# Patient Record
Sex: Female | Born: 1975 | Hispanic: No | Marital: Married | Smoking: Former smoker
Health system: Southern US, Community
[De-identification: ages and names within clinical notes are randomized; demographics above are authoritative.]

## PROBLEM LIST (undated history)

## (undated) ENCOUNTER — Inpatient Hospital Stay (HOSPITAL_COMMUNITY): Payer: Self-pay

## (undated) DIAGNOSIS — H8091 Unspecified otosclerosis, right ear: Secondary | ICD-10-CM

## (undated) DIAGNOSIS — G8929 Other chronic pain: Secondary | ICD-10-CM

## (undated) DIAGNOSIS — H9201 Otalgia, right ear: Secondary | ICD-10-CM

## (undated) DIAGNOSIS — R51 Headache: Secondary | ICD-10-CM

## (undated) DIAGNOSIS — R519 Headache, unspecified: Secondary | ICD-10-CM

## (undated) DIAGNOSIS — H9191 Unspecified hearing loss, right ear: Secondary | ICD-10-CM

## (undated) HISTORY — DX: Otalgia, right ear: H92.01

## (undated) HISTORY — DX: Unspecified otosclerosis, right ear: H80.91

## (undated) HISTORY — DX: Other chronic pain: G89.29

---

## 1997-07-27 DIAGNOSIS — G931 Anoxic brain damage, not elsewhere classified: Secondary | ICD-10-CM

## 2015-05-29 ENCOUNTER — Ambulatory Visit (INDEPENDENT_AMBULATORY_CARE_PROVIDER_SITE_OTHER): Payer: Medicaid Other | Admitting: Family Medicine

## 2015-05-29 VITALS — BP 104/69 | HR 74 | Temp 98.3°F | Ht 62.0 in | Wt 157.7 lb

## 2015-05-29 DIAGNOSIS — H9201 Otalgia, right ear: Secondary | ICD-10-CM

## 2015-05-29 DIAGNOSIS — G8929 Other chronic pain: Secondary | ICD-10-CM

## 2015-05-29 DIAGNOSIS — Z758 Other problems related to medical facilities and other health care: Secondary | ICD-10-CM

## 2015-05-29 DIAGNOSIS — Z008 Encounter for other general examination: Secondary | ICD-10-CM | POA: Diagnosis not present

## 2015-05-29 DIAGNOSIS — Z0289 Encounter for other administrative examinations: Secondary | ICD-10-CM

## 2015-05-29 DIAGNOSIS — Z789 Other specified health status: Secondary | ICD-10-CM | POA: Diagnosis not present

## 2015-05-29 MED ORDER — ACETAMINOPHEN 325 MG PO TABS: 650.0000 mg | ORAL_TABLET | Freq: Four times a day (QID) | ORAL | Status: DC | PRN

## 2015-05-29 NOTE — Patient Instructions (Signed)
Return in 2-3 weeks Take tylenol for head ache If you have questions or concerns, [facility]

## 2015-05-31 DIAGNOSIS — G8929 Other chronic pain: Secondary | ICD-10-CM

## 2015-05-31 DIAGNOSIS — R519 Headache, unspecified: Secondary | ICD-10-CM | POA: Insufficient documentation

## 2015-05-31 DIAGNOSIS — Z758 Other problems related to medical facilities and other health care: Secondary | ICD-10-CM | POA: Insufficient documentation

## 2015-05-31 DIAGNOSIS — Z789 Other specified health status: Secondary | ICD-10-CM | POA: Insufficient documentation

## 2015-05-31 DIAGNOSIS — H9201 Otalgia, right ear: Secondary | ICD-10-CM

## 2015-05-31 DIAGNOSIS — R51 Headache: Secondary | ICD-10-CM

## 2015-05-31 DIAGNOSIS — Z0289 Encounter for other administrative examinations: Secondary | ICD-10-CM | POA: Insufficient documentation

## 2015-05-31 HISTORY — DX: Other chronic pain: G89.29

## 2015-05-31 NOTE — Assessment & Plan Note (Signed)
Follow labs 

## 2015-05-31 NOTE — Progress Notes (Signed)
Arabic interpreter  utilized during today's visit.  [facility] New [facility] Visit  HPI:  [facility] patient appointment to establish general primary care, also to discuss daily headaches since arriving to the [place]. Also reports right ear ache for one year,   Right ear ache She has a history of infection in this ear requiring antibiotics 3 years ago and was told she would need imaging but could not afford it. She reports that since this infection, she has not been able to hear from this ear as well. Denies fevers/chills. Her ear pain has been stable over the last year  Headache. Reports daily headache since arriving to the [place] on 04[place]05/2015. The pain wraps around her head from the back of her head to her forehead. She reports taking Profen ( an analogue to Ibuprofen) which she got prior to arriving to the [place]. She denies history of migraines, deniss changes in vision or associated aura. She typically takes 2-3 Profen tablets per day for her headache. Thinks thiis may be related to stress and feels she is  "constantly thinking"  ROS: Denies recent illness, fevers/chills, cough, N/V/D otherwise see HPI  Past Medical Hx:  - Decreased right ear hearing after infection three years ago  Past Surgical Hx:  -Cesarean section x3  OBHx - [id number]  Family Hx: updated in Epic - Number of family members:  5, children and her husband - Number of family members in [place]:  5, children and her husband  Immigrant Social History: - Name spelling correct?: Yes - Date arrived in [place]: 04/05/2015 - Country of origin: [place] - Location of refugee camp (if applicable), how long there, and what caused patient to leave home country?: [place] - Primary language: Arabic  -Requires intepreter (essentially speaks no [place]) - Education: Highest level of education: 9th grade - Prior work: Psychologist, occupationalHouse wife - Designer, fashion/clothingTobacco/alcohol/drug use: Denies - Marriage Status: Married - Sexual  activity: With husband - Class B conditions: unknown - Were you beaten or tortured in your country or refugee camp?  No   Preventative Care History: -Seen at health department?: Yes  PHYSICAL EXAM: BP 104/69 mmHg  Pulse 74  Temp(Src) 98.3 F (36.8 C) (Oral)  Ht 5\' 2"  (1.575 m)  Wt 157 lb 11.2 oz (71.532 kg)  BMI 28.84 kg/m2  LMP 05/22/2015 (Approximate) Gen: NAD HEENT: PERRL, no conjunctival pallor, normal ears and TMs bilaterally, MMM, normal oropharynx Neck:  No cervical lymphadenopathy Heart: RRR, no murmurs Lungs: CTAB, normal WOB Abdomen: soft non tender Skin:  No lesions noted Neuro: no focal deficits Psych: normal mood and affect  Examined and interviewed with Dr. Gwendolyn GrantWalden  Refugee health examination Follow labs  Headache Given location of pain, onset after arriving to [place] and association with concurrent stressors her headache is like due to tension - Tylenol 650 q6 PRN for headache - If develops red flag symptoms will evaluate further  Chronic pain in right ear History of infection in the ear with longstanding decreased hearing, now with chronic ( 1 year history ) of stable pain with no fevers, not concerned about acute infection - Will consider referral to ENT for work up at next visit for further workup   Dorn Hartshorne A. Kennon RoundsHaney MD, MS Family Medicine Resident PGY-2 Pager [phone]

## 2015-05-31 NOTE — Assessment & Plan Note (Signed)
Given location of pain, onset [place] to [place] and association with concurrent stressors her headache is like due to tension - Tylenol 650 q6 PRN for headache - If develops red flag symptoms will evaluate further

## 2015-05-31 NOTE — Assessment & Plan Note (Signed)
History of infection in the ear with longstanding decreased hearing, now with chronic ( 1 year history ) of stable pain with no fevers, not concerned about acute infection - Will consider referral to ENT for work up at next visit for further workup

## 2015-06-19 ENCOUNTER — Ambulatory Visit: Payer: Self-pay | Admitting: Student

## 2015-06-24 ENCOUNTER — Ambulatory Visit (INDEPENDENT_AMBULATORY_CARE_PROVIDER_SITE_OTHER): Payer: Medicaid Other | Admitting: Student

## 2015-06-24 ENCOUNTER — Encounter: Payer: Self-pay | Admitting: Student

## 2015-06-24 VITALS — BP 127/77 | HR 77 | Temp 98.0°F | Ht 62.0 in | Wt 161.4 lb

## 2015-06-24 DIAGNOSIS — H9201 Otalgia, right ear: Secondary | ICD-10-CM | POA: Diagnosis not present

## 2015-06-24 DIAGNOSIS — G8929 Other chronic pain: Secondary | ICD-10-CM | POA: Diagnosis not present

## 2015-06-24 DIAGNOSIS — Z0000 Encounter for general adult medical examination without abnormal findings: Secondary | ICD-10-CM

## 2015-06-24 NOTE — Assessment & Plan Note (Signed)
Discussed need for routine pap smears but pt states she had one [facility] de[facility] - Will follow up results

## 2015-06-24 NOTE — Progress Notes (Signed)
   Subjective:    Patient ID: Cheryl Bauer, female    DOB: 02/23/1976, 39 y.o.   MRN: [id number]   CC: Follow up  HPI 39 y/o refugee F for follow up. At her last visit she complained of headache and chronic R ear pain and decreased hearing. She otherwise denies complaints, denies fevers, chills, N/V/D, cough, SOB, chest pain  R ear pain/Decreased hearing - Stable per patient.  - Right ear pain, is not constant  Headaches - Denies headache today and headaches have been helped with an OTC medicine that her husband has been bringing for her - She was given a prescritpion for tylenol at her last visit, that she did not fill   Review of Systems   See HPI for ROS.   No past medical history on file. No past surgical history on file. OB History    No data available     Social History   Social History  . Marital Status: Married    Spouse Name: N/A  . Number of Children: N/A  . Years of Education: N/A   Occupational History  . Not on file.   Social History Main Topics  . Smoking status: Never Smoker   . Smokeless tobacco: Not on file  . Alcohol Use: Not on file  . Drug Use: Not on file  . Sexual Activity: Not on file   Other Topics Concern  . 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 Kennon RoundsHaney MD, MS Family Medicine Resident PGY-2 Pager [phone]

## 2015-06-24 NOTE — Patient Instructions (Signed)
Follow up in 2 months If you have questions or concerns, call the office at [phone]

## 2015-06-24 NOTE — Assessment & Plan Note (Signed)
Stable,  - Discussed ENT follow up, however, pt states that she does not yet have Medicaid - Will wait until conformation of medicaid insurance before obtaining this consult

## 2015-08-20 ENCOUNTER — Telehealth: Payer: Self-pay | Admitting: Student

## 2015-08-20 DIAGNOSIS — G8929 Other chronic pain: Secondary | ICD-10-CM

## 2015-08-20 DIAGNOSIS — H9201 Otalgia, right ear: Principal | ICD-10-CM

## 2015-08-20 NOTE — Telephone Encounter (Signed)
Pt is needing a referral to ENT.  Please call Micah Noel because pt doesn't speak [place].

## 2015-08-20 NOTE — Telephone Encounter (Signed)
Will forward to MD.  Patient has been seen in our clinic multiple times for chronic right ear pain. Cheryl Bauer,CMA

## 2015-08-21 NOTE — Telephone Encounter (Signed)
Pt calling once more about this request. Pt was informed of message below. I checked patient's medicaid and it is in fact current and valid. Sadie Reynolds, ASA

## 2015-08-21 NOTE — Telephone Encounter (Signed)
Will place refferal, but per our last conversation she did not yet have medicaid and this was a barrier.

## 2015-10-18 ENCOUNTER — Telehealth: Payer: Self-pay | Admitting: Student

## 2015-10-18 NOTE — Telephone Encounter (Signed)
Will forward to referral coordinator to see if we need a copy of card before we can resubmit. Jazmin Hartsell,CMA

## 2015-10-18 NOTE — Telephone Encounter (Signed)
Pt has now changed her Medicaid card to our name and would the referral to the ENT sent again. jw

## 2015-10-18 NOTE — Telephone Encounter (Signed)
Referral re-sent to [facility].

## 2015-11-04 DIAGNOSIS — H9011 Conductive hearing loss, unilateral, right ear, with unrestricted hearing on the contralateral side: Secondary | ICD-10-CM | POA: Insufficient documentation

## 2015-11-25 DIAGNOSIS — H9191 Unspecified hearing loss, right ear: Secondary | ICD-10-CM

## 2015-11-25 HISTORY — DX: Unspecified hearing loss, right ear: H91.91

## 2015-12-02 ENCOUNTER — Ambulatory Visit: Payer: Self-pay | Admitting: Otolaryngology

## 2015-12-02 ENCOUNTER — Encounter (HOSPITAL_BASED_OUTPATIENT_CLINIC_OR_DEPARTMENT_OTHER): Payer: Self-pay | Admitting: *Deleted

## 2015-12-02 NOTE — H&P (Signed)
HPI:   Chief Complaint  Patient presents with  . Otalgia  Right ear pain with headaches on and off  . Hearing Loss  right ear   Cheryl Bauer is a 40 y.o. female who presents for decreased hearing in the right ear for about three years. She is a [place] refugee. Was exposed to noise in her home country but she cannot tell if anything specifically caused the change in hearing. Change described as gradual, not sudden. Hearing seems normal in the left ear. No audiometric testing. No ear pain, tinnitus, dizziness, facial weakness/numbness. No exposure to ototoxic medications. No history of recurrent ear infections or ear surgeries. Non-smoker. Interpreter present.   PMH/Meds/All/SocHx/FamHx/ROS:   No past medical history on file.  No past surgical history on file.  No family history of bleeding disorders, wound healing problems or difficulty with anesthesia.   Social History   Social History  . Marital status: Married  Spouse name: N/A  . Number of children: N/A  . Years of education: N/A   Occupational History  . Not on file.   Social History Main Topics  . Smoking status: Never Smoker  . Smokeless tobacco: Never Used  . Alcohol use No  . Drug use: Not on file  . Sexual activity: Not on file   Other Topics Concern  . Not on file   Social History Narrative  . No narrative on file   No current outpatient prescriptions on file.  A complete ROS was performed with pertinent positives/negatives noted in the HPI. The remainder of the ROS are negative.   Physical Exam:   BP 127/77  Ht 1.575 m (5\' 2" )  Wt 73 kg (161 lb)  BMI 29.45 kg/m2  General Awake, at baseline alertness during examination.  Eyes No scleral icterus or conjunctival hemorrhage. Globe position appears normal. EOMI.  Right Ear EAC patent, TM intact w/o inflammation. Middle ear well aerated.  Left Ear EAC patent, TM intact w/o inflammation. Middle ear well aerated.  Nose Patent, no polyps or masses  seen on anterior rhinoscopy.  Oral cavity No mucosal lesions or tumors seen. Tongue midline.  Oropharynx Symmetric tonsils, 2+.  Neck No abnormal cervical lymphadenopathy. No thyromegaly. No thyroid masses palpated.  Cardio-vascular No cyanosis.  Pulmonary No audible stridor. Breathing easily with no labor.  Neuro Symmetric facial movement.  Psychiatry Appropriate affect and mood for clinic visit.   Independent Review of Additional Tests or Records:  Medical records.  Procedures:  Audiometry: pure tone audiogram reveals normal hearing on the left. Right ear with severe conductive hearing loss measuring at 70dB and rising to 50dB.  Could not test word understanding due to language barrier.  Otoacoustic reflexes out of phase bilaterally.  Tympanometry: type A bilaterally.   Weber lateralizes to the right (512 Hz tuning fork).  Impression & Plans:  Cheryl Milroytimad Stalzer is a 40 y.o. 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 We discussed the nature of this condition. Is not a dangerous condition but it does cause progressive hearing loss. There are 3 her options for management. The first does do nothing and live with hearing loss. The second option is hearing aid. Hearing aids work extremely well with conductive hearing loss. The third option is middle ear surgery. This would involve middle ear exploration and stapedectomy. We discussed a 90% chance of success, a 1% chance of permanent hearing loss, and a 9-10% chance of needing revision surgery. This can be done as an outpatient  with overnight stay. The most common side effect postoperatively is dizziness which may last 4 days or weeks. It will be important to avoid any strenuous activity and exposure to water or sudden pressure changes for 3-4 weeks. We discussed that surgery does not stop the progression of the disease and revision surgery is oftentimes necessary years later.  Patient elects to pursue  the surgical option. I would like her to meet with Dr. Pollyann Kennedy for a more in depth discussion. She agrees with the plan.

## 2015-12-02 NOTE — Pre-Procedure Instructions (Signed)
Pt's friend, Luther RedoGehan Khaled, will be interpreter DOS

## 2015-12-04 ENCOUNTER — Ambulatory Visit (HOSPITAL_BASED_OUTPATIENT_CLINIC_OR_DEPARTMENT_OTHER)
Admission: RE | Admit: 2015-12-04 | Discharge: 2015-12-05 | Disposition: A | Discharge status: Home / Self Care | Payer: Medicaid Other | Source: Ambulatory Visit | Attending: Otolaryngology | Admitting: Otolaryngology

## 2015-12-04 ENCOUNTER — Ambulatory Visit (HOSPITAL_BASED_OUTPATIENT_CLINIC_OR_DEPARTMENT_OTHER): Payer: Medicaid Other | Admitting: Certified Registered"

## 2015-12-04 ENCOUNTER — Encounter (HOSPITAL_BASED_OUTPATIENT_CLINIC_OR_DEPARTMENT_OTHER)
Admission: RE | Disposition: A | Discharge status: Home / Self Care | Payer: Self-pay | Source: Ambulatory Visit | Attending: Otolaryngology

## 2015-12-04 ENCOUNTER — Encounter (HOSPITAL_BASED_OUTPATIENT_CLINIC_OR_DEPARTMENT_OTHER): Payer: Self-pay | Admitting: Anesthesiology

## 2015-12-04 DIAGNOSIS — H9191 Unspecified hearing loss, right ear: Secondary | ICD-10-CM | POA: Diagnosis not present

## 2015-12-04 DIAGNOSIS — R51 Headache: Secondary | ICD-10-CM | POA: Insufficient documentation

## 2015-12-04 DIAGNOSIS — H8091 Unspecified otosclerosis, right ear: Secondary | ICD-10-CM | POA: Diagnosis present

## 2015-12-04 HISTORY — DX: Unspecified otosclerosis, right ear: H80.91

## 2015-12-04 HISTORY — DX: Headache: R51

## 2015-12-04 HISTORY — PX: STAPEDECTOMY: SHX2435

## 2015-12-04 HISTORY — DX: Headache, unspecified: R51.9

## 2015-12-04 HISTORY — DX: Unspecified hearing loss, right ear: H91.91

## 2015-12-04 SURGERY — STAPEDECTOMY
Anesthesia: General | Site: Ear | Laterality: Right

## 2015-12-04 MED ORDER — ONDANSETRON HCL 4 MG/2ML IJ SOLN
INTRAMUSCULAR | Status: AC
  Filled 2015-12-04: qty 2

## 2015-12-04 MED ORDER — METHYLENE BLUE 0.5 % INJ SOLN
INTRAVENOUS | Status: DC | PRN
  Administered 2015-12-04: .5 mL

## 2015-12-04 MED ORDER — BACITRACIN ZINC 500 UNIT/GM EX OINT
TOPICAL_OINTMENT | CUTANEOUS | Status: AC
  Filled 2015-12-04: qty 0.9

## 2015-12-04 MED ORDER — CIPROFLOXACIN-DEXAMETHASONE 0.3-0.1 % OT SUSP
3.0000 [drp] | Freq: Three times a day (TID) | OTIC | Status: DC
  Administered 2015-12-04 – 2015-12-05 (×2): 3 [drp] via OTIC
  Filled 2015-12-04: qty 7.5

## 2015-12-04 MED ORDER — PROPOFOL 10 MG/ML IV BOLUS
INTRAVENOUS | Status: AC
  Filled 2015-12-04: qty 20

## 2015-12-04 MED ORDER — MIDAZOLAM HCL 2 MG/2ML IJ SOLN
1.0000 mg | INTRAMUSCULAR | Status: DC | PRN
  Administered 2015-12-04: 2 mg via INTRAVENOUS

## 2015-12-04 MED ORDER — MEPERIDINE HCL 25 MG/ML IJ SOLN: 6.2500 mg | INTRAMUSCULAR | Status: DC | PRN

## 2015-12-04 MED ORDER — HYDROMORPHONE HCL 1 MG/ML IJ SOLN
0.2500 mg | INTRAMUSCULAR | Status: DC | PRN
  Administered 2015-12-04 (×2): 25 mg via INTRAVENOUS

## 2015-12-04 MED ORDER — LIDOCAINE 2% (20 MG/ML) 5 ML SYRINGE
INTRAMUSCULAR | Status: DC | PRN
  Administered 2015-12-04: 80 mg via INTRAVENOUS

## 2015-12-04 MED ORDER — DIAZEPAM 2 MG PO TABS: 2.0000 mg | ORAL_TABLET | Freq: Four times a day (QID) | ORAL | Status: DC | PRN

## 2015-12-04 MED ORDER — SCOPOLAMINE 1 MG/3DAYS TD PT72: 1.0000 | MEDICATED_PATCH | Freq: Once | TRANSDERMAL | Status: DC | PRN

## 2015-12-04 MED ORDER — BACITRACIN ZINC 500 UNIT/GM EX OINT
TOPICAL_OINTMENT | CUTANEOUS | Status: DC | PRN
  Administered 2015-12-04: 1 via TOPICAL

## 2015-12-04 MED ORDER — HYDROMORPHONE HCL 1 MG/ML IJ SOLN
INTRAMUSCULAR | Status: AC
  Filled 2015-12-04: qty 1

## 2015-12-04 MED ORDER — CIPROFLOXACIN-DEXAMETHASONE 0.3-0.1 % OT SUSP
OTIC | Status: AC
  Filled 2015-12-04: qty 7.5

## 2015-12-04 MED ORDER — DIAZEPAM 2 MG PO TABS
2.0000 mg | ORAL_TABLET | Freq: Four times a day (QID) | ORAL | Status: DC | PRN
  Administered 2015-12-04 (×2): 2 mg via ORAL
  Filled 2015-12-04 (×2): qty 1

## 2015-12-04 MED ORDER — ONDANSETRON HCL 4 MG/2ML IJ SOLN
INTRAMUSCULAR | Status: DC | PRN
  Administered 2015-12-04: 4 mg via INTRAVENOUS

## 2015-12-04 MED ORDER — LIDOCAINE-EPINEPHRINE 1 %-1:100000 IJ SOLN
INTRAMUSCULAR | Status: DC | PRN
  Administered 2015-12-04: 1 mL

## 2015-12-04 MED ORDER — HYDROCODONE-ACETAMINOPHEN 5-325 MG PO TABS
1.0000 | ORAL_TABLET | ORAL | Status: DC | PRN
  Administered 2015-12-04: 2 via ORAL
  Administered 2015-12-05: 1 via ORAL
  Filled 2015-12-04 (×2): qty 2

## 2015-12-04 MED ORDER — CIPROFLOXACIN-DEXAMETHASONE 0.3-0.1 % OT SUSP: 3.0000 [drp] | Freq: Three times a day (TID) | OTIC | Status: DC

## 2015-12-04 MED ORDER — OXYCODONE HCL 5 MG PO TABS: 5.0000 mg | ORAL_TABLET | Freq: Once | ORAL | Status: DC | PRN

## 2015-12-04 MED ORDER — MIDAZOLAM HCL 2 MG/2ML IJ SOLN
INTRAMUSCULAR | Status: AC
  Filled 2015-12-04: qty 2

## 2015-12-04 MED ORDER — DEXTROSE-NACL 5-0.9 % IV SOLN
INTRAVENOUS | Status: DC
  Administered 2015-12-04: 15:00:00 via INTRAVENOUS

## 2015-12-04 MED ORDER — GLYCOPYRROLATE 0.2 MG/ML IJ SOLN: 0.2000 mg | Freq: Once | INTRAMUSCULAR | Status: DC | PRN

## 2015-12-04 MED ORDER — PROMETHAZINE HCL 25 MG/ML IJ SOLN: 6.2500 mg | INTRAMUSCULAR | Status: DC | PRN

## 2015-12-04 MED ORDER — PROMETHAZINE HCL 25 MG RE SUPP: 25.0000 mg | Freq: Four times a day (QID) | RECTAL | Status: DC | PRN

## 2015-12-04 MED ORDER — SUCCINYLCHOLINE CHLORIDE 200 MG/10ML IV SOSY
PREFILLED_SYRINGE | INTRAVENOUS | Status: AC
  Filled 2015-12-04: qty 10

## 2015-12-04 MED ORDER — EPINEPHRINE HCL 1 MG/ML IJ SOLN
INTRAMUSCULAR | Status: DC | PRN
  Administered 2015-12-04: 1 mg

## 2015-12-04 MED ORDER — LACTATED RINGERS IV SOLN
INTRAVENOUS | Status: DC
  Administered 2015-12-04 (×2): via INTRAVENOUS

## 2015-12-04 MED ORDER — OXYCODONE HCL 5 MG/5ML PO SOLN: 5.0000 mg | Freq: Once | ORAL | Status: DC | PRN

## 2015-12-04 MED ORDER — FENTANYL CITRATE (PF) 100 MCG/2ML IJ SOLN
INTRAMUSCULAR | Status: AC
  Filled 2015-12-04: qty 2

## 2015-12-04 MED ORDER — HYDROCODONE-ACETAMINOPHEN 7.5-325 MG PO TABS: 1.0000 | ORAL_TABLET | Freq: Four times a day (QID) | ORAL | Status: DC | PRN

## 2015-12-04 MED ORDER — SUCCINYLCHOLINE CHLORIDE 200 MG/10ML IV SOSY
PREFILLED_SYRINGE | INTRAVENOUS | Status: DC | PRN
  Administered 2015-12-04: 80 mg via INTRAVENOUS

## 2015-12-04 MED ORDER — PROMETHAZINE HCL 25 MG PO TABS: 25.0000 mg | ORAL_TABLET | Freq: Four times a day (QID) | ORAL | Status: DC | PRN

## 2015-12-04 MED ORDER — FENTANYL CITRATE (PF) 100 MCG/2ML IJ SOLN
50.0000 ug | INTRAMUSCULAR | Status: DC | PRN
  Administered 2015-12-04: 100 ug via INTRAVENOUS

## 2015-12-04 MED ORDER — PROPOFOL 10 MG/ML IV BOLUS
INTRAVENOUS | Status: DC | PRN
  Administered 2015-12-04: 150 mg via INTRAVENOUS

## 2015-12-04 MED ORDER — DEXAMETHASONE SODIUM PHOSPHATE 4 MG/ML IJ SOLN
INTRAMUSCULAR | Status: DC | PRN
  Administered 2015-12-04: 10 mg via INTRAVENOUS

## 2015-12-04 MED ORDER — CIPROFLOXACIN-DEXAMETHASONE 0.3-0.1 % OT SUSP
OTIC | Status: DC | PRN
  Administered 2015-12-04: 4 [drp] via OTIC

## 2015-12-04 MED ORDER — SUCCINYLCHOLINE CHLORIDE 20 MG/ML IJ SOLN: INTRAMUSCULAR | Status: DC | PRN

## 2015-12-04 MED ORDER — PROMETHAZINE HCL 25 MG RE SUPP
25.0000 mg | Freq: Four times a day (QID) | RECTAL | Status: DC | PRN
  Administered 2015-12-04: 25 mg via RECTAL
  Filled 2015-12-04: qty 1

## 2015-12-04 MED ORDER — ARTIFICIAL TEARS OP OINT
TOPICAL_OINTMENT | OPHTHALMIC | Status: AC
  Filled 2015-12-04: qty 3.5

## 2015-12-04 MED ORDER — LIDOCAINE 2% (20 MG/ML) 5 ML SYRINGE
INTRAMUSCULAR | Status: AC
  Filled 2015-12-04: qty 5

## 2015-12-04 MED ORDER — IBUPROFEN 100 MG/5ML PO SUSP: 400.0000 mg | Freq: Four times a day (QID) | ORAL | Status: DC | PRN

## 2015-12-04 MED ORDER — DEXAMETHASONE SODIUM PHOSPHATE 10 MG/ML IJ SOLN
INTRAMUSCULAR | Status: AC
  Filled 2015-12-04: qty 1

## 2015-12-04 SURGICAL SUPPLY — 41 items
BLADE CLIPPER SURG (BLADE) IMPLANT
CANISTER SUCT 1200ML W/VALVE (MISCELLANEOUS) ×4 IMPLANT
CLEANER CAUTERY TIP 5X5 PAD (MISCELLANEOUS) IMPLANT
COTTONBALL LRG STERILE PKG (GAUZE/BANDAGES/DRESSINGS) ×4 IMPLANT
CUP LIPPY PISTON MOD .4X4.5 ×4 IMPLANT
DECANTER SPIKE VIAL GLASS SM (MISCELLANEOUS) ×4 IMPLANT
DERMABOND ADVANCED (GAUZE/BANDAGES/DRESSINGS)
DERMABOND ADVANCED .7 DNX12 (GAUZE/BANDAGES/DRESSINGS) IMPLANT
DRAPE MICROSCOPE URBAN (DRAPES) ×4 IMPLANT
DROPPER MEDICINE STER 1.5ML LF (MISCELLANEOUS) IMPLANT
ELECT COATED BLADE 2.86 ST (ELECTRODE) IMPLANT
ELECT REM PT RETURN 9FT ADLT (ELECTROSURGICAL)
ELECTRODE REM PT RTRN 9FT ADLT (ELECTROSURGICAL) IMPLANT
GLOVE BIOGEL PI IND STRL 7.0 (GLOVE) ×4 IMPLANT
GLOVE BIOGEL PI INDICATOR 7.0 (GLOVE) ×4
GLOVE ECLIPSE 6.5 STRL STRAW (GLOVE) ×4 IMPLANT
GLOVE ECLIPSE 7.5 STRL STRAW (GLOVE) ×4 IMPLANT
GOWN STRL REUS W/ TWL LRG LVL3 (GOWN DISPOSABLE) ×2 IMPLANT
GOWN STRL REUS W/ TWL XL LVL3 (GOWN DISPOSABLE) ×2 IMPLANT
GOWN STRL REUS W/TWL LRG LVL3 (GOWN DISPOSABLE) ×2
GOWN STRL REUS W/TWL XL LVL3 (GOWN DISPOSABLE) ×2
IV CATH AUTO 14GX1.75 SAFE ORG (IV SOLUTION) IMPLANT
IV SET EXT 30 76VOL 4 MALE LL (IV SETS) ×4 IMPLANT
NDL SAFETY ECLIPSE 18X1.5 (NEEDLE) ×2 IMPLANT
NEEDLE FILTER BLUNT 18X 1/2SAF (NEEDLE) ×2
NEEDLE FILTER BLUNT 18X1 1/2 (NEEDLE) ×2 IMPLANT
NEEDLE HYPO 18GX1.5 SHARP (NEEDLE) ×2
NEEDLE PRECISIONGLIDE 27X1.5 (NEEDLE) ×4 IMPLANT
NS IRRIG 1000ML POUR BTL (IV SOLUTION) ×4 IMPLANT
PACK BASIN DAY SURGERY FS (CUSTOM PROCEDURE TRAY) ×4 IMPLANT
PACK ENT DAY SURGERY (CUSTOM PROCEDURE TRAY) ×4 IMPLANT
PAD CLEANER CAUTERY TIP 5X5 (MISCELLANEOUS)
PENCIL FOOT CONTROL (ELECTRODE) IMPLANT
SHEET MEDIUM DRAPE 40X70 STRL (DRAPES) IMPLANT
SLEEVE SCD COMPRESS KNEE MED (MISCELLANEOUS) ×4 IMPLANT
SPONGE SURGIFOAM ABS GEL 12-7 (HEMOSTASIS) IMPLANT
SUT CHROMIC 4 0 P 3 18 (SUTURE) IMPLANT
SUT PLAIN 5 0 P 3 18 (SUTURE) IMPLANT
TOWEL OR 17X24 6PK STRL BLUE (TOWEL DISPOSABLE) ×4 IMPLANT
TOWEL OR NON WOVEN STRL DISP B (DISPOSABLE) ×4 IMPLANT
TRAY DSU PREP LF (CUSTOM PROCEDURE TRAY) ×4 IMPLANT

## 2015-12-04 NOTE — H&P (View-Only) (Signed)
HPI:   Chief Complaint  Patient presents with  . Otalgia  Right ear pain with headaches on and off  . Hearing Loss  right ear   Cheryl Bauer is a 40 y.o. female who presents for decreased hearing in the right ear for about three years. She is a [place] refugee. Was exposed to noise in her home country but she cannot tell if anything specifically caused the change in hearing. Change described as gradual, not sudden. Hearing seems normal in the left ear. No audiometric testing. No ear pain, tinnitus, dizziness, facial weakness/numbness. No exposure to ototoxic medications. No history of recurrent ear infections or ear surgeries. Non-smoker. Interpreter present.   PMH/Meds/All/SocHx/FamHx/ROS:   No past medical history on file.  No past surgical history on file.  No family history of bleeding disorders, wound healing problems or difficulty with anesthesia.   Social History   Social History  . Marital status: Married  Spouse name: N/A  . Number of children: N/A  . Years of education: N/A   Occupational History  . Not on file.   Social History Main Topics  . Smoking status: Never Smoker  . Smokeless tobacco: Never Used  . Alcohol use No  . Drug use: Not on file  . Sexual activity: Not on file   Other Topics Concern  . Not on file   Social History Narrative  . No narrative on file   No current outpatient prescriptions on file.  A complete ROS was performed with pertinent positives/negatives noted in the HPI. The remainder of the ROS are negative.   Physical Exam:   BP 127/77  Ht 1.575 m (5\' 2" )  Wt 73 kg (161 lb)  BMI 29.45 kg/m2  General Awake, at baseline alertness during examination.  Eyes No scleral icterus or conjunctival hemorrhage. Globe position appears normal. EOMI.  Right Ear EAC patent, TM intact w/o inflammation. Middle ear well aerated.  Left Ear EAC patent, TM intact w/o inflammation. Middle ear well aerated.  Nose Patent, no polyps or masses  seen on anterior rhinoscopy.  Oral cavity No mucosal lesions or tumors seen. Tongue midline.  Oropharynx Symmetric tonsils, 2+.  Neck No abnormal cervical lymphadenopathy. No thyromegaly. No thyroid masses palpated.  Cardio-vascular No cyanosis.  Pulmonary No audible stridor. Breathing easily with no labor.  Neuro Symmetric facial movement.  Psychiatry Appropriate affect and mood for clinic visit.   Independent Review of Additional Tests or Records:  Medical records.  Procedures:  Audiometry: pure tone audiogram reveals normal hearing on the left. Right ear with severe conductive hearing loss measuring at 70dB and rising to 50dB.  Could not test word understanding due to language barrier.  Otoacoustic reflexes out of phase bilaterally.  Tympanometry: type A bilaterally.   Weber lateralizes to the right (512 Hz tuning fork).  Impression & Plans:  Cheryl Milroytimad Stalzer is a 40 y.o. 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 We discussed the nature of this condition. Is not a dangerous condition but it does cause progressive hearing loss. There are 3 her options for management. The first does do nothing and live with hearing loss. The second option is hearing aid. Hearing aids work extremely well with conductive hearing loss. The third option is middle ear surgery. This would involve middle ear exploration and stapedectomy. We discussed a 90% chance of success, a 1% chance of permanent hearing loss, and a 9-10% chance of needing revision surgery. This can be done as an outpatient  with overnight stay. The most common side effect postoperatively is dizziness which may last 4 days or weeks. It will be important to avoid any strenuous activity and exposure to water or sudden pressure changes for 3-4 weeks. We discussed that surgery does not stop the progression of the disease and revision surgery is oftentimes necessary years later.  Patient elects to pursue  the surgical option. I would like her to meet with Dr. Marasia Newhall for a more in depth discussion. She agrees with the plan.  

## 2015-12-04 NOTE — Anesthesia Preprocedure Evaluation (Signed)
Anesthesia Evaluation  Patient identified by MRN, date of birth, ID band Patient awake    Reviewed: Allergy & Precautions, NPO status , Patient's Chart, lab work & pertinent test results  Airway Mallampati: II  TM Distance: >3 FB Neck ROM: Full    Dental no notable dental hx.    Pulmonary neg pulmonary ROS,    Pulmonary exam normal breath sounds clear to auscultation       Cardiovascular negative cardio ROS Normal cardiovascular exam Rhythm:Regular Rate:Normal     Neuro/Psych  Headaches, negative psych ROS   GI/Hepatic negative GI ROS, Neg liver ROS,   Endo/Other  negative endocrine ROS  Renal/GU negative Renal ROS     Musculoskeletal negative musculoskeletal ROS (+)   Abdominal   Peds  Hematology negative hematology ROS (+)   Anesthesia Other Findings   Reproductive/Obstetrics negative OB ROS                             Anesthesia Physical Anesthesia Plan  ASA: I  Anesthesia Plan: General   Post-op Pain Management:    Induction: Intravenous  Airway Management Planned: Oral ETT  Additional Equipment:   Intra-op Plan:   Post-operative Plan: Extubation in OR  Informed Consent: I have reviewed the patients History and Physical, chart, labs and discussed the procedure including the risks, benefits and alternatives for the proposed anesthesia with the patient or authorized representative who has indicated his/her understanding and acceptance.   Dental advisory given  Plan Discussed with: CRNA  Anesthesia Plan Comments:         Anesthesia Quick Evaluation

## 2015-12-04 NOTE — Anesthesia Procedure Notes (Signed)
Procedure Name: Intubation Date/Time: 12/04/2015 11:37 AM Performed by: Gar GibbonKEETON, Ayana Imhof S Pre-anesthesia Checklist: Patient identified, Emergency Drugs available, Suction available and Patient being monitored Patient Re-evaluated:Patient Re-evaluated prior to inductionOxygen Delivery Method: Circle System Utilized Preoxygenation: Pre-oxygenation with 100% oxygen Intubation Type: IV induction Ventilation: Mask ventilation without difficulty Laryngoscope Size: Mac and 3 Grade View: Grade I Tube type: Oral Rae Tube size: 7.0 mm Number of attempts: 1 Airway Equipment and Method: Stylet and Oral airway Placement Confirmation: ETT inserted through vocal cords under direct vision,  positive ETCO2 and breath sounds checked- equal and bilateral Secured at: 21 cm Tube secured with: Tape Dental Injury: Teeth and Oropharynx as per pre-operative assessment

## 2015-12-04 NOTE — Op Note (Signed)
OPERATIVE REPORT  DATE OF SURGERY: 12/04/2015  PATIENT:  Cheryl Bauer,  40 y.o. female  PRE-OPERATIVE DIAGNOSIS:  hearing loss right ear  POST-OPERATIVE DIAGNOSIS:  hearing loss right ear  PROCEDURE:  Procedure(s): RIGHT SIDE  STAPEDECTOMY  SURGEON:  Susy FrizzleJefry H Sadi Arave, MD  ASSISTANTS: None  ANESTHESIA:   General   EBL:  5 ml  DRAINS: None  LOCAL MEDICATIONS USED:  1% Xylocaine with epinephrine  SPECIMEN:  Right stapes  COUNTS:  Correct  PROCEDURE DETAILS: The patient was taken to the operating room and placed on the operating table in the supine position. Following induction of general endotracheal anesthesia, the right ear was prepped and draped in a standard fashion. Local anesthetic was infiltrated into 4 quadrants of the external auditory canal. The tragus was also infiltrated with local anesthetic solution. A posteriorly based tympanomeatal flap was created with a round knife and brought forward exposing the middle ear. Topical adrenaline on cotton was used as needed for hemostasis. The middle ear was clear of any inflammatory disease. Thoracic chain was stiff throughout but the majority seemed to be from the stapes. The footplate was also thickened. A tragal perichondrial graft was harvested. The incision was reapproximated with 5-0 plain gut. The incudostapedial joint was divided. The stapedial tendon was cut. A control hole was placed in the footplate. There was no perilymph gusher. The superstructure was down fractured along the promontory and removed in pieces. The footplate came out in its entirety and was thickened. The perichondrial graft was placed into position. Saline soaked Gelfoam was packed into the anterior tympanic cavity to obstruct the eustachian tube orifice. A 4.5 mm length Lippy modified Robinson bucket-handle prosthesis was then placed into position. There appeared to be adequate ossicular mobility. The tympanomeatal flap was brought back to its native position  and secured in place with Ciprodex-soaked Gelfoam. A cotton ball with bacitracin was placed at the external meatus. The patient was awakened, extubated and transferred to recovery in stable condition.    PATIENT DISPOSITION:  To PACU, stable

## 2015-12-04 NOTE — Discharge Instructions (Signed)
Avoid nose blowing, bending over, lifting anything, straining in any way. Open your mouth if you have to sneeze. Keep all water out of the right ear.  Use ear drops 3 times daily and replace a fresh cotton ball after that.   Post Anesthesia Home Care Instructions  Activity: Get plenty of rest for the remainder of the day. A responsible adult should stay with you for 24 hours following the procedure.  For the next 24 hours, DO NOT: -Drive a car -Advertising copywriterperate machinery -Drink alcoholic beverages -Take any medication unless instructed by your physician -Make any legal decisions or sign important papers.  Meals: Start with liquid foods such as gelatin or soup. Progress to regular foods as tolerated. Avoid greasy, spicy, heavy foods. If nausea and/or vomiting occur, drink only clear liquids until the nausea and/or vomiting subsides. Call your physician if vomiting continues.  Special Instructions/Symptoms: Your throat may feel dry or sore from the anesthesia or the breathing tube placed in your throat during surgery. If this causes discomfort, gargle with warm salt water. The discomfort should disappear within 24 hours.  If you had a scopolamine patch placed behind your ear for the management of post- operative nausea and/or vomiting:  1. The medication in the patch is effective for 72 hours, after which it should be removed.  Wrap patch in a tissue and discard in the trash. Wash hands thoroughly with soap and water. 2. You may remove the patch earlier than 72 hours if you experience unpleasant side effects which may include dry mouth, dizziness or visual disturbances. 3. Avoid touching the patch. Wash your hands with soap and water after contact with the patch.   .Marland Kitchen

## 2015-12-04 NOTE — Transfer of Care (Signed)
Immediate Anesthesia Transfer of Care Note  Patient: Cheryl Bauer  Procedure(s) Performed: Procedure(s): RIGHT SIDE TYMPANOTOMY EXPLORATORY WITH OSSICULAR RECONSTRUCTION OR STAPEDECTOMY (Right)  Patient Location: PACU  Anesthesia Type:General  Level of Consciousness: sedated, lethargic and responds to stimulation  Airway & Oxygen Therapy: Patient Spontanous Breathing and Patient connected to face mask oxygen  Post-op Assessment: Report given to RN and Post -op Vital signs reviewed and stable  Post vital signs: Reviewed and stable  Last Vitals:  Filed Vitals:   12/04/15 1024  BP: 114/67  Pulse: 70  Temp: 36.4 C  Resp: 16    Last Pain: There were no vitals filed for this visit.       Complications: No apparent anesthesia complications

## 2015-12-04 NOTE — Anesthesia Postprocedure Evaluation (Signed)
Anesthesia Post Note  Patient: Cheryl Bauer  Procedure(s) Performed: Procedure(s) (LRB): RIGHT SIDE TYMPANOTOMY EXPLORATORY WITH OSSICULAR RECONSTRUCTION OR STAPEDECTOMY (Right)  Patient location during evaluation: PACU Anesthesia Type: General Level of consciousness: sedated and patient cooperative Pain management: pain level controlled Vital Signs Assessment: post-procedure vital signs reviewed and stable Respiratory status: spontaneous breathing Cardiovascular status: stable Anesthetic complications: no    Last Vitals:  Filed Vitals:   12/04/15 1420 12/04/15 1440  BP: 111/80 116/79  Pulse: 79 87  Temp:  36.2 C  Resp: 17 16    Last Pain:  Filed Vitals:   12/04/15 1450  PainSc: 0-No pain                 Lewie LoronJohn Horice Carrero

## 2015-12-04 NOTE — Interval H&P Note (Signed)
History and Physical Interval Note:  12/04/2015 11:02 AM  Cheryl Bauer  has presented today for surgery, with the diagnosis of hearing loss right ear  The various methods of treatment have been discussed with the patient and family. After consideration of risks, benefits and other options for treatment, the patient has consented to  Procedure(s): RIGHT SIDE TYMPANOTOMY EXPLORATORY WITH OSSICULAR RECONSTRUCTION OR STAPEDECTOMY (Right) as a surgical intervention .  The patient's history has been reviewed, patient examined, no change in status, stable for surgery.  I have reviewed the patient's chart and labs.  Questions were answered to the patient's satisfaction.     [name]

## 2015-12-05 ENCOUNTER — Encounter (HOSPITAL_BASED_OUTPATIENT_CLINIC_OR_DEPARTMENT_OTHER): Payer: Self-pay | Admitting: Otolaryngology

## 2015-12-05 DIAGNOSIS — H9191 Unspecified hearing loss, right ear: Secondary | ICD-10-CM | POA: Diagnosis not present

## 2015-12-05 NOTE — Progress Notes (Signed)
Patient ID: Cheryl Bauer, female   DOB: 03/04/1976, 40 y.o.   MRN: [id number] Not much pain, dizziness improving. Facial nerve normal function. Tuning fork lateralizes to the right ear. Stable post op. Discharge home.

## 2015-12-09 ENCOUNTER — Encounter (HOSPITAL_BASED_OUTPATIENT_CLINIC_OR_DEPARTMENT_OTHER): Payer: Self-pay | Admitting: Otolaryngology

## 2016-04-05 ENCOUNTER — Emergency Department (HOSPITAL_COMMUNITY)
Admission: EM | Admit: 2016-04-05 | Discharge: 2016-04-05 | Disposition: A | Discharge status: Home / Self Care | Payer: Medicaid Other | Attending: Emergency Medicine | Admitting: Emergency Medicine

## 2016-04-05 ENCOUNTER — Encounter (HOSPITAL_COMMUNITY): Payer: Self-pay | Admitting: Emergency Medicine

## 2016-04-05 DIAGNOSIS — R55 Syncope and collapse: Secondary | ICD-10-CM | POA: Diagnosis present

## 2016-04-05 DIAGNOSIS — R1110 Vomiting, unspecified: Secondary | ICD-10-CM | POA: Insufficient documentation

## 2016-04-05 DIAGNOSIS — F17210 Nicotine dependence, cigarettes, uncomplicated: Secondary | ICD-10-CM | POA: Diagnosis not present

## 2016-04-05 DIAGNOSIS — R1111 Vomiting without nausea: Secondary | ICD-10-CM

## 2016-04-05 LAB — URINALYSIS, ROUTINE W REFLEX MICROSCOPIC
Bilirubin Urine: NEGATIVE
GLUCOSE, UA: NEGATIVE mg/dL
KETONES UR: NEGATIVE mg/dL
LEUKOCYTES UA: NEGATIVE
Nitrite: NEGATIVE
PROTEIN: NEGATIVE mg/dL
Specific Gravity, Urine: 1.024 (ref 1.005–1.030)
pH: 5.5 (ref 5.0–8.0)

## 2016-04-05 LAB — URINE MICROSCOPIC-ADD ON

## 2016-04-05 LAB — BASIC METABOLIC PANEL
Anion gap: 8 (ref 5–15)
BUN: 17 mg/dL (ref 6–20)
CHLORIDE: 103 mmol/L (ref 101–111)
CO2: 25 mmol/L (ref 22–32)
CREATININE: 0.59 mg/dL (ref 0.44–1.00)
Calcium: 9.4 mg/dL (ref 8.9–10.3)
GFR calc non Af Amer: >60 mL/min (ref >60)
Glucose, Bld: 116 mg/dL — ABNORMAL HIGH (ref 65–99)
Potassium: 3.7 mmol/L (ref 3.5–5.1)
Sodium: 136 mmol/L (ref 135–145)

## 2016-04-05 LAB — CBC
HEMATOCRIT: 38.3 % (ref 36.0–46.0)
Hemoglobin: 12.3 g/dL (ref 12.0–15.0)
MCH: 25.9 pg — AB (ref 26.0–34.0)
MCHC: 32.1 g/dL (ref 30.0–36.0)
MCV: 80.8 fL (ref 78.0–100.0)
PLATELETS: 268 10*3/uL (ref 150–400)
RBC: 4.74 MIL/uL (ref 3.87–5.11)
RDW: 13 % (ref 11.5–15.5)
WBC: 7.9 10*3/uL (ref 4.0–10.5)

## 2016-04-05 LAB — POC URINE PREG, ED: Preg Test, Ur: NEGATIVE

## 2016-04-05 LAB — CBG MONITORING, ED: Glucose-Capillary: 101 mg/dL — ABNORMAL HIGH (ref 65–99)

## 2016-04-05 NOTE — ED Triage Notes (Signed)
[place] park with [place] and had a syncopal episode. Lasted about 10 seconds per family. Diaphoretic on EMS arrival. CBG 170. VSS 126/79, 88, 98%. Reports usual state of health yesterday and today prior to episode. Post event, feels dizziness and nausea. Given 4mg  Zofran by EMS. Denies pain. A&Ox4 on arrival.

## 2016-04-05 NOTE — ED Notes (Addendum)
Error

## 2016-04-05 NOTE — Discharge Instructions (Signed)
Go to your primary care doctor tomorrow to be reevaluated. Be sure to drink plenty of  water. Return immediately to the emergency department if you experience numbness/tingling, weakness, headache, dizziness, you passed out again, belly pain, vomiting, chest pain, shortness of breath or any other concerning symptoms.

## 2016-04-06 NOTE — ED Provider Notes (Signed)
[facility]-EMERGENCY DEPT Provider Note   CSN: [id number] Arrival date & time: 04/05/16  1827     History   Chief Complaint Chief Complaint  Patient presents with  . Loss of Consciousness    HPI Cheryl Bauer is a 40 y.o. female.  HPI   Patient and husband were the historians. An interpreter for Arabic was used. Patient is a 40 year old female with a history of headaches and hearing loss in right ear s/p surgery who presents the emergency department after a syncopal episode that occurred roughly 30 minutes PTA. Patient states she was having bilateral eye pressure and right ear pain just prior to her syncope. Family states it took roughly 5 minutes to wake her. One episode of emesis while in the EMS truck. Patient currently denies any symptoms. Patient states she had an episode of syncope roughly 1 year ago while in her [place] with the same prodrome. Patient denies chest pain, shortness of breath, abdominal pain, dysuria, hematuria, numbness/swelling, weakness, dizziness. Family states patient did not hit her head.  Past Medical History:  Diagnosis Date  . Headache    not specified by pt. - occasional  . 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 Healthcare maintenance 06/24/2015  . Language barrier 05/31/2015  . Refugee health examination 05/31/2015  . Headache 05/31/2015  . 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 CESAREAN SECTION    . STAPEDECTOMY Right 12/04/2015   Procedure: RIGHT SIDE TYMPANOTOMY EXPLORATORY WITH STAPEDECTOMY;  Surgeon: Serena Colonel, MD;  Location: [facility];  Service: ENT;  Laterality: Right;    OB History    No data available       Home Medications    Prior to Admission medications   Medication Sig Start Date End Date Taking? Authorizing Provider  ciprofloxacin-dexamethasone (CIPRODEX) otic  suspension Place 3 drops into the right ear 3 (three) times daily. 12/04/15   Serena Colonel, MD  diazepam (VALIUM) 2 MG tablet Take 1 tablet (2 mg total) by mouth every 6 (six) hours as needed for anxiety. 12/04/15   Serena Colonel, MD  HYDROcodone-acetaminophen (NORCO) 7.5-325 MG tablet Take 1 tablet by mouth every 6 (six) hours as needed for moderate pain. 12/04/15   Serena Colonel, MD  promethazine (PHENERGAN) 25 MG suppository Place 1 suppository (25 mg total) rectally every 6 (six) hours as needed for nausea or vomiting. 12/04/15   Serena Colonel, MD    Family History History reviewed. No pertinent family history.  Social History Social History  Substance Use Topics  . Smoking status: Current Every Day Smoker    Types: E-cigarettes  . Smokeless tobacco: Never Used     Comment: [place]  . Alcohol use No     Allergies   Review of patient's allergies indicates no known allergies.   Review of Systems Review of Systems  Constitutional: Negative for chills and fever.  HENT: Positive for ear pain. Negative for ear discharge, facial swelling, sore throat and trouble swallowing.   Eyes: Positive for pain. Negative for photophobia, discharge, redness, itching and visual disturbance.  Respiratory: Negative for cough, chest tightness and shortness of breath.   Cardiovascular: Negative for chest pain and leg swelling.  Gastrointestinal: Negative for abdominal distention, abdominal pain, diarrhea, nausea and vomiting.  Genitourinary: Negative for dysuria, hematuria and vaginal discharge.  Musculoskeletal: Negative for neck  pain.  Skin: Negative for rash and wound.  Neurological: Positive for syncope. Negative for dizziness, weakness, numbness and headaches.  Psychiatric/Behavioral: Negative for confusion.     Physical Exam Updated Vital Signs BP 115/70   Pulse 87   Temp 97.4 F (36.3 C) (Oral)   Resp 16   LMP 04/05/2016 (Exact Date)   SpO2 100%   Physical Exam  Constitutional: Pt is  oriented to person, place, and time. Pt appears well-developed and well-nourished. No distress.  HENT:  Head: Normocephalic and atraumatic.  Mouth/Throat: Oropharynx is clear and moist.  Eyes: Conjunctivae and EOM are normal. Pupils are equal, round, and reactive to light. No scleral icterus.  No horizontal, vertical or rotational nystagmus  Neck: Normal range of motion. Neck supple.  Full active and passive ROM without pain No nuchal rigidity or meningeal signs  Cardiovascular: Normal rate, regular rhythm and intact distal pulses including radial and DP 2+ bilaterally.   Pulmonary/Chest: Effort normal  No respiratory distress, CTA.  Abdominal: Soft. There is no tenderness. There is no rebound and no guarding.  bowel sounds are normal.  Musculoskeletal: Normal range of motion.  Neurological: Pt. is alert and oriented to person, place, and time. No cranial nerve deficit.  Exhibits normal muscle tone. Coordination normal.  Mental Status:  Alert, oriented, thought content appropriate. Speech fluent without evidence of aphasia. Able to follow 2 step commands without difficulty.  Cranial Nerves:  II:  Peripheral visual fields grossly normal, pupils equal, round, reactive to light III,IV, VI: ptosis not present, extra-ocular motions intact bilaterally  V,VII: smile symmetric, facial light touch sensation equal VIII: hearing grossly normal bilaterally  IX,X: midline uvula rise  XI: bilateral shoulder shrug equal and strong XII: midline tongue extension  Motor:  5/5 in upper and lower extremities bilaterally including strong and equal grip strength and dorsiflexion/plantar flexion Sensory: light touch normal in all extremities.  Cerebellar: normal finger-to-nose with bilateral upper extremities, pronator drift negative Gait: normal gait and balance Skin: Skin is warm and dry. No rash noted. Pt is not diaphoretic.  Psychiatric: Pt has a normal mood and affect. Behavior is normal. Judgment and  thought content normal.  Nursing note and vitals reviewed.   ED Treatments / Results  Labs (all labs ordered are listed, but only abnormal results are displayed) Labs Reviewed  BASIC METABOLIC PANEL - Abnormal; Notable for the following:       Result Value   Glucose, Bld 116 (*)    All other components within normal limits  CBC - Abnormal; Notable for the following:    MCH 25.9 (*)    All other components within normal limits  URINALYSIS, ROUTINE W REFLEX MICROSCOPIC (NOT AT [facility]) - Abnormal; Notable for the following:    APPearance CLOUDY (*)    Hgb urine dipstick MODERATE (*)    All other components within normal limits  URINE MICROSCOPIC-ADD ON - Abnormal; Notable for the following:    Squamous Epithelial / LPF 0-5 (*)    Bacteria, UA MANY (*)    All other components within normal limits  CBG MONITORING, ED - Abnormal; Notable for the following:    Glucose-Capillary 101 (*)    All other components within normal limits  POC URINE PREG, ED    EKG  EKG Interpretation  Date/Time:  Sunday April 05 2016 18:40:51 EDT Ventricular Rate:  86 PR Interval:    QRS Duration: 85 QT Interval:  379 QTC Calculation: 454 R Axis:   23 Text Interpretation:  Sinus rhythm No previous tracing Confirmed by Denton Lank  MD, Caryn Bee ([id number]) on 04/05/2016 6:49:47 PM       Radiology No results found.  Procedures Procedures (including critical care time)  Medications Ordered in ED Medications - No data to display   Initial Impression / Assessment and Plan / ED Course  I have reviewed the triage vital signs and the nursing notes.  Pertinent labs & imaging results that were available during my care of the patient were reviewed by me and considered in my medical decision making (see chart for details).  Clinical Course   Patient with syncopal episode. EKG without acute abnormalities. Labs unremarkable, VSS. Episode likely vasovagal syncope. Patient did not hit her head and no neurological  deficits on exam. No indication for imaging at this time. UA revealed hematuria, patient is currently on her menstrual period likely a contaminant. UA revealed many bacteria but patient asymptomatic. Will not treat at this time. Instructed patient to follow-up with her primary care provider tomorrow. Discussed strict return precautions the ED. Patient and family expressed understanding to the discharge instructions.  Pt case discussed and pt seen by Dr. Denton Lank who agrees with the above plan.  Final Clinical Impressions(s) / ED Diagnoses   Final diagnoses:  Syncope and collapse  Non-intractable vomiting without nausea, vomiting of unspecified type    New Prescriptions Discharge Medication List as of 04/05/2016  9:52 PM       Jerre Simon, PA 04/08/16 1408    Cathren Laine, MD 04/10/16 1342

## 2016-07-06 ENCOUNTER — Encounter: Payer: Self-pay | Admitting: Family Medicine

## 2016-07-06 ENCOUNTER — Ambulatory Visit (INDEPENDENT_AMBULATORY_CARE_PROVIDER_SITE_OTHER): Payer: Medicaid Other | Admitting: Family Medicine

## 2016-07-06 VITALS — BP 122/84 | HR 127 | Temp 98.1°F | Wt 169.0 lb

## 2016-07-06 DIAGNOSIS — M542 Cervicalgia: Secondary | ICD-10-CM

## 2016-07-06 MED ORDER — CYCLOBENZAPRINE HCL 5 MG PO TABS: 5.0000 mg | ORAL_TABLET | Freq: Three times a day (TID) | ORAL | 1 refills | Status: DC | PRN

## 2016-07-06 MED ORDER — MELOXICAM 7.5 MG PO TABS: 7.5000 mg | ORAL_TABLET | Freq: Every day | ORAL | 0 refills | Status: DC

## 2016-07-06 NOTE — Progress Notes (Signed)
   Subjective:  Cheryl Bauer is a 40 y.o. female who [facility] with a chief complaint of neck pain and back pain. History is provided by patient via video arabic interpretor.   HPI:  Back Pain Started two weeks ago. Associated with neck pain, muscle aches, and arm pain. Pain will radiate from her lower back into her neck. Tried taking some OTC pain medications which helped some. No LE numbness or weakness. No bowel or bladder incontinence. Has a history of "congenital shoulder dislocation." No recent falls, injuries, or other obvious precipitating events. Pain is dull and feels like a throbbing sensation. Patient is concerned that she may have cancer as her grandfather was just diagnosed with this.   ROS: Per HPI  Objective:  Physical Exam: BP 122/84   Pulse (!) 127   Temp 98.1 F (36.7 C) (Oral)   Wt 169 lb (76.7 kg)   BMI 29.01 kg/m   Gen: NAD, resting comfortably CV: RRR with no murmurs appreciated Pulm: NWOB, CTAB with no crackles, wheezes, or rhonchi GI: Normal bowel sounds present. Soft, Nontender, Nondistended. MSK:  - Back/Neck: No deformities. Tender to palpation over cervical paraspinal muscles. No bony tenderness. - LE: FROM, strength 5/5 throughout - UE: FROM. Strength 5/5 throughout. Spurling negative.  Skin: warm, dry Neuro: grossly normal, moves all extremities Psych: Patient intermittently tearful while discussing her grandfather's diagnosis.   Assessment/Plan:  Neck Pain Seems to be mostly MSK in nature given muscular tenderness. No red flag signs or symptoms. Possibly related to recent URI. Reassured patient. Discussed the fact that the findings today are not indicative of a cancer diagnosis. Given her muscular tenderness, will treat with a 2 week course of mobic and flexeril. Strict return precautions reviewed. Follow up as needed.   Katina Degreealeb M. Jimmey RalphParker, MD [facility] Resident PGY-3 07/06/2016 3:32 PM

## 2016-07-06 NOTE — Patient Instructions (Signed)
Take the mobic once a day for two weeks.  Take the flexeril as needed.  Come back if it is not improving in 1-2 weeks.  Take care,  Dr Jimmey RalphParker

## 2016-07-08 MED FILL — CYCLOBENZAPRINE 5 MG TABLET: 5 | 10 days supply | Qty: 30 | Fill #0

## 2016-07-08 MED FILL — MELOXICAM 7.5 MG TABLET: 7.5 | 30 days supply | Qty: 30 | Fill #0

## 2016-08-07 ENCOUNTER — Ambulatory Visit (INDEPENDENT_AMBULATORY_CARE_PROVIDER_SITE_OTHER): Payer: Medicaid Other | Admitting: Family Medicine

## 2016-08-07 ENCOUNTER — Encounter: Payer: Self-pay | Admitting: Family Medicine

## 2016-08-07 VITALS — BP 106/64 | HR 96 | Temp 97.8°F | Ht 64.0 in | Wt 165.0 lb

## 2016-08-07 DIAGNOSIS — G44209 Tension-type headache, unspecified, not intractable: Secondary | ICD-10-CM

## 2016-08-07 DIAGNOSIS — Z3200 Encounter for pregnancy test, result unknown: Secondary | ICD-10-CM

## 2016-08-07 DIAGNOSIS — Z3201 Encounter for pregnancy test, result positive: Secondary | ICD-10-CM | POA: Diagnosis not present

## 2016-08-07 LAB — POCT URINE PREGNANCY: Preg Test, Ur: POSITIVE — AB

## 2016-08-07 MED ORDER — PRENATAL 19 PO TABS: 1.0000 | ORAL_TABLET | Freq: Every day | ORAL | 6 refills | Status: DC

## 2016-08-07 MED FILL — PRENATAL 19 TABLET: 30 days supply | Qty: 30 | Fill #0

## 2016-08-07 NOTE — Assessment & Plan Note (Signed)
  Information given to contact adopt-a-mom, patient agreed to call List of safe medications given Prenatal vitamin prescribed to patient's pharmacy

## 2016-08-07 NOTE — Patient Instructions (Addendum)
  Call adopt-a-mom to get set up for prenatal care.  Please start taking pre-natal vitamins.  A heating pad would be very helpful for your neck pain. Cool washclothes or ice packs may also help. Try gentle stretching and massage the scalp/neck for added relief.  Safe Medications in Pregnancy   Acne:  Benzoyl Peroxide  Salicylic Acid   Backache/Headache:  Tylenol: 2 regular strength every 4 hours OR        2 Extra strength every 6 hours   Colds/Coughs/Allergies:  Benadryl (alcohol free) 25 mg every 6 hours as needed  Breath right strips  Claritin  Cepacol throat lozenges  Chloraseptic throat spray  Cold-Eeze- up to three times per day  Cough drops, alcohol free  Flonase (by prescription only)  Guaifenesin  Mucinex  Robitussin DM (plain only, alcohol free)  Saline nasal spray/drops  Sudafed (pseudoephedrine) & Actifed * use only after [redacted] weeks gestation and if you do not have high blood pressure  Tylenol  Vicks Vaporub  Zinc lozenges  Zyrtec   Constipation:  Colace  Ducolax suppositories  Fleet enema  Glycerin suppositories  Metamucil  Milk of magnesia  Miralax  Senokot  Smooth move tea   Diarrhea:  Kaopectate  Imodium A-D   *NO pepto Bismol   Hemorrhoids:  Anusol  Anusol HC  Preparation H  Tucks   Indigestion:  Tums  Maalox  Mylanta  Zantac  Pepcid   Insomnia:  Benadryl (alcohol free) 25mg  every 6 hours as needed  Tylenol PM  Unisom, no Gelcaps   Leg Cramps:  Tums  MagGel   Nausea/Vomiting:  Bonine  Dramamine  Emetrol  Ginger extract  Sea bands  Meclizine  Nausea medication to take during pregnancy:  Unisom (doxylamine succinate 25 mg tablets) Take one tablet daily at bedtime. If symptoms are not adequately controlled, the dose can be increased to a maximum recommended dose of two tablets daily (1/2 tablet in the morning, 1/2 tablet mid-afternoon and one at bedtime).  Vitamin B6 100mg  tablets. Take one tablet twice a day (up to  200 mg per day).   Skin Rashes:  Aveeno products  Benadryl cream or 25mg  every 6 hours as needed  Calamine Lotion  1% cortisone cream   Yeast infection:  Gyne-lotrimin 7  Monistat 7    **If taking multiple medications, please check labels to avoid duplicating the same active ingredients  **take medication as directed on the label  ** Do not exceed 4000 mg of tylenol in 24 hours  **Do not take medications that contain aspirin or ibuprofen

## 2016-08-07 NOTE — Assessment & Plan Note (Signed)
  Started after hearing about fathers death. With new pregnancy would not do more than tylenol for pain, patient concerned she needs imaging but would not pursue that at this time  -tylenol as needed for pain -use heating pad or ice packs for additional relief -advised gentle stretching to help with pain -follow up if pain worsens or fails to improve

## 2016-08-07 NOTE — Progress Notes (Signed)
    Subjective:    Patient ID: Cheryl Bauer, female    DOB: 11/21/1975, 41 y.o.   MRN: [id number]   CC: neck pain, possibly pregnant  Neck pain-  Has been having pain for more than 1 month in the back of her neck that causes headaches, has not improved. Happened after hearing about her father passing away. Was given medicines at last visit for it but was afraid she was pregnant so did not take them. She just got tylenol yesterday and has not tried it yet incase it was unsafe for potential pregnancy. She feels better laying down. Worse walking around and sitting up. No vision changes or neurologic changes. Headaches feel tight around her whole head. Back of neck is sore but does not hurt with movement.  Worried she may be pregnant, was supposed to get period November 21 but did not. She is nauseated with decreased appetite. Took a home pregnancy test and was unsure if it was positive.     Objective:  BP 106/64   Pulse 96   Temp 97.8 F (36.6 C) (Oral)   Ht 5\' 4"  (1.626 m)   Wt 165 lb (74.8 kg)   LMP 07/20/2016 (Approximate)   SpO2 98%   BMI 28.32 kg/m  Vitals and nursing note reviewed  General: well nourished, in no acute distress Neck: supple, normal active ROM without pain. Tender to palpation of posterior neck especially over occipital region Cardiac: RRR, clear S1 and S2, no murmurs, rubs, or gallops Respiratory: clear to auscultation bilaterally, no increased work of breathing Extremities: no edema or cyanosis.  Neuro: alert and oriented, no focal deficits   Assessment & Plan:    Positive pregnancy test  Information given to contact adopt-a-mom, patient agreed to call List of safe medications given Prenatal vitamin prescribed to patient's pharmacy  Tension headache  Started after hearing about fathers death. With new pregnancy would not do more than tylenol for pain, patient concerned she needs imaging but would not pursue that at this time  -tylenol as needed for  pain -use heating pad or ice packs for additional relief -advised gentle stretching to help with pain -follow up if pain worsens or fails to improve    Return if symptoms worsen or fail to improve.   Dolores PattyAngela Omayra Tulloch, DO Family Medicine Resident PGY-1

## 2016-08-19 ENCOUNTER — Other Ambulatory Visit: Payer: Self-pay

## 2016-08-19 ENCOUNTER — Encounter: Payer: Self-pay | Admitting: Family Medicine

## 2016-08-26 ENCOUNTER — Ambulatory Visit (INDEPENDENT_AMBULATORY_CARE_PROVIDER_SITE_OTHER): Payer: Medicaid Other | Admitting: Obstetrics and Gynecology

## 2016-08-26 ENCOUNTER — Encounter: Payer: Self-pay | Admitting: Obstetrics and Gynecology

## 2016-08-26 ENCOUNTER — Other Ambulatory Visit (HOSPITAL_COMMUNITY)
Admission: RE | Admit: 2016-08-26 | Discharge: 2016-08-26 | Disposition: A | Discharge status: Home / Self Care | Payer: Medicaid Other | Source: Ambulatory Visit | Attending: Family Medicine | Admitting: Family Medicine

## 2016-08-26 VITALS — BP 110/60 | HR 88 | Temp 98.1°F | Wt 164.0 lb

## 2016-08-26 DIAGNOSIS — Z3482 Encounter for supervision of other normal pregnancy, second trimester: Secondary | ICD-10-CM

## 2016-08-26 DIAGNOSIS — Z01419 Encounter for gynecological examination (general) (routine) without abnormal findings: Secondary | ICD-10-CM | POA: Insufficient documentation

## 2016-08-26 DIAGNOSIS — Z113 Encounter for screening for infections with a predominantly sexual mode of transmission: Secondary | ICD-10-CM | POA: Diagnosis present

## 2016-08-26 DIAGNOSIS — O0990 Supervision of high risk pregnancy, unspecified, unspecified trimester: Secondary | ICD-10-CM | POA: Insufficient documentation

## 2016-08-26 DIAGNOSIS — N898 Other specified noninflammatory disorders of vagina: Secondary | ICD-10-CM

## 2016-08-26 DIAGNOSIS — Z23 Encounter for immunization: Secondary | ICD-10-CM | POA: Diagnosis not present

## 2016-08-26 DIAGNOSIS — Z1151 Encounter for screening for human papillomavirus (HPV): Secondary | ICD-10-CM | POA: Diagnosis not present

## 2016-08-26 LAB — POCT WET PREP (WET MOUNT)
Clue Cells Wet Prep Whiff POC: NEGATIVE
TRICHOMONAS WET PREP HPF POC: ABSENT

## 2016-08-26 NOTE — Progress Notes (Signed)
Cheryl Bauer is a 41 y.o. female [id number] at [id number] who presents for her initial prenatal visit. Phone interpretor used during encounter.   Pregnancy  is not planned She reports fatigue, nausea and headaches. She  is Taking PNV.  Past Medical History:  Diagnosis Date  . Headache    not specified by pt. - occasional  . 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 CESAREAN SECTION    . STAPEDECTOMY Right 12/04/2015   Procedure: RIGHT SIDE TYMPANOTOMY EXPLORATORY WITH STAPEDECTOMY;  Surgeon: Serena ColonelJefry Rosen, MD;  Location: [facility];  Service: ENT;  Laterality: Right;   OB History  Gravida Para Term Preterm AB Living  6 5 5     4   SAB TAB Ectopic Multiple Live Births          5    # Outcome Date GA Lbr Len/2nd Weight Sex Delivery Anes PTL Lv  6 Current           5 Term 07/27/08    M CS-LTranv   LIV  4 Term 10/28/06    F CS-LTranv   LIV  3 Term 07/27/02    F Vag-Spont   LIV  2 Term 08/11/99    M Vag-Spont   LIV  1 Term 07/27/97    F CS-LTranv   ND     Complications: Hypoxia of brain (HCC)    Lost first child 2 days after birth. States she had a bad labor and baby suffered from hypoxia.  No pregnancy complications in other births  GYN Hx Denies any abnormal pap smears. Normal menstraul periods.   No family history on file. Denies birth defects   Current Outpatient Prescriptions on File Prior to Visit  Medication Sig Dispense Refill  . cyclobenzaprine (FLEXERIL) 5 MG tablet Take 1 tablet (5 mg total) by mouth 3 (three) times daily as needed for muscle spasms. 30 tablet 1  . meloxicam (MOBIC) 7.5 MG tablet Take 1 tablet (7.5 mg total) by mouth daily. 30 tablet 0  . Prenatal Vit-DSS-Fe Fum-FA (PRENATAL 19) tablet Take 1 tablet by mouth daily. 30 tablet 6  . promethazine (PHENERGAN) 25 MG suppository Place 1 suppository (25 mg total) rectally every 6 (six) hours as needed for nausea or vomiting. 12 suppository 1   No  current facility-administered medications on file prior to visit.    No Known Allergies  Social History   Social History  . Marital status: Married    Spouse name: N/A  . Number of children: N/A  . Years of education: N/A   Occupational History  . Not on file.   Social History Main Topics  . Smoking status: Current Every Day Smoker    Types: E-cigarettes  . Smokeless tobacco: Never Used     Comment: [place]  . Alcohol use No  . Drug use: No  . Sexual activity: Yes    Birth control/ protection: None   Other Topics Concern  . Not on file   Social History Narrative  . No narrative on file   See flow sheet for details.  Prenatal exam: Gen: Well nourished, well developed.  No distress.  Vitals noted. HEENT: Normocephalic, atraumatic.  Neck supple without cervical lymphadenopathy, thyromegaly or thyroid nodules.  CV: RRR no murmur, gallops or rubs Lungs: CTA B.  Normal respiratory effort without wheezes or rales. Abd: soft, NTND. +BS.  Uterus appreciated above pelvis. GU: Normal external female genitalia without  lesions.  Nl vaginal, well rugated without lesions. Positive vaginal discharge.  Bimanual exam: No adnexal mass or TTP. No CMT. Ext: No clubbing, cyanosis or edema. Psych: Normal grooming and dress.  Not depressed or anxious appearing.  Normal thought content and process without flight of ideas or looseness of associations   Assessment/plan: 1) Pregnancy [redacted]w[redacted]d doing well. Estimated Date of Delivery: 02/24/17 by LMP.  Current pregnancy issues include:  1. Encounter for supervision of other normal pregnancy in second trimester Continue routine prenatal care. Initial labs obtained today (see orders). Anatomy scan scheduled.   2. Vaginal discharge Vaginal discharge on exam. Wet prep consistent with yeast. Will treat with miconazole vaginal cream. STD testing pending.  - POCT Wet Prep ([place])  3. Encounter for immunization - Flu Vaccine QUAD 36+ mos IM  Dating is  reliable by LMP.  Prenatal labs collected today.  Pap smear performed today Flu vaccine given. Importance of prenatal vitamins reviewed.  Genetic screening offered. Patient wants genetic testing. Will obtain Quad screen at next visit.  Early glucola is not indicated.  Anatomy ultrasound ordered to be scheduled at 18-19 weeks. Bleeding and pain precautions reviewed. Handout given on second trimester.  Follow up 4 weeks.  [name], DO 08/26/2016, 9:45 AM PGY-3, [facility]

## 2016-08-26 NOTE — Patient Instructions (Addendum)
Lab work collected today.  [facility] testing [facility]  Anatomy scan ordered. Someone will call to schedule appointment.    Second Trimester of Pregnancy The second trimester is from week 13 through week 28, month 4 through 6. This is often the time in pregnancy that you feel your best. Often times, morning sickness has lessened or quit. You may have more energy, and you may get hungry more often. Your unborn baby (fetus) is growing rapidly. At the end of the sixth month, he or she is about 9 inches long and weighs about 1 pounds. You will likely feel the baby move (quickening) between 18 and 20 weeks of pregnancy. Follow these instructions at home:  Avoid all smoking, herbs, and alcohol. Avoid drugs not approved by your doctor.  Do not use any tobacco products, including cigarettes, chewing tobacco, and electronic cigarettes. If you need help quitting, ask your doctor. You may get counseling or other support to help you quit.  Only take medicine as told by your doctor. Some medicines are safe and some are not during pregnancy.  Exercise only as told by your doctor. Stop exercising if you start having cramps.  Eat regular, healthy meals.  Wear a good support bra if your breasts are tender.  Do not use hot tubs, steam rooms, or saunas.  Wear your seat belt when driving.  Avoid raw meat, uncooked cheese, and liter boxes and soil used by cats.  Take your prenatal vitamins.  Take 1500-2000 milligrams of calcium daily starting at the 20th week of pregnancy until you deliver your baby.  Try taking medicine that helps you poop (stool softener) as needed, and if your doctor approves. Eat more fiber by eating fresh fruit, vegetables, and whole grains. Drink enough fluids to keep your pee (urine) clear or pale yellow.  Take warm water baths (sitz baths) to soothe pain or discomfort caused by hemorrhoids. Use hemorrhoid cream if your doctor approves.  If you have  puffy, bulging veins (varicose veins), wear support hose. Raise (elevate) your feet for 15 minutes, 3-4 times a day. Limit salt in your diet.  Avoid heavy lifting, wear low heals, and sit up straight.  Rest with your legs raised if you have leg cramps or low back pain.  Visit your dentist if you have not gone during your pregnancy. Use a soft toothbrush to brush your teeth. Be gentle when you floss.  You can have sex (intercourse) unless your doctor tells you not to.  Go to your doctor visits. Get help if:  You feel dizzy.  You have mild cramps or pressure in your lower belly (abdomen).  You have a nagging pain in your belly area.  You continue to feel sick to your stomach (nauseous), throw up (vomit), or have watery poop (diarrhea).  You have bad smelling fluid coming from your vagina.  You have pain with peeing (urination). Get help right away if:  You have a fever.  You are leaking fluid from your vagina.  You have spotting or bleeding from your vagina.  You have severe belly cramping or pain.  You lose or gain weight rapidly.  You have trouble catching your breath and have chest pain.  You notice sudden or extreme puffiness (swelling) of your face, hands, ankles, feet, or legs.  You have not felt the baby move in over an hour.  You have severe headaches that do not go away with medicine.  You have vision  changes. This information is not intended to replace advice given to you by your health care provider. Make sure you discuss any questions you have with your health care provider. Document Released: 10/07/2009 Document Revised: 12/19/2015 Document Reviewed: 09/13/2012 Elsevier Interactive Patient Education  2017 [employer].

## 2016-08-27 ENCOUNTER — Other Ambulatory Visit: Payer: Self-pay | Admitting: Obstetrics and Gynecology

## 2016-08-27 ENCOUNTER — Encounter: Payer: Self-pay | Admitting: Obstetrics and Gynecology

## 2016-08-27 LAB — OBSTETRIC PANEL
ANTIBODY SCREEN: NEGATIVE
BASOS PCT: 0 %
Basophils Absolute: 0 cells/uL (ref 0–200)
Eosinophils Absolute: 59 cells/uL (ref 15–500)
Eosinophils Relative: 1 %
HCT: 33.3 % — ABNORMAL LOW (ref 35.0–45.0)
HEMOGLOBIN: 10.9 g/dL — AB (ref 11.7–15.5)
Hepatitis B Surface Ag: NEGATIVE
LYMPHS PCT: 27 %
Lymphs Abs: 1593 cells/uL (ref 850–3900)
MCH: 26 pg — AB (ref 27.0–33.0)
MCHC: 32.7 g/dL (ref 32.0–36.0)
MCV: 79.5 fL — ABNORMAL LOW (ref 80.0–100.0)
MONO ABS: 354 {cells}/uL (ref 200–950)
MPV: 9.1 fL (ref 7.5–12.5)
Monocytes Relative: 6 %
Neutro Abs: 3894 cells/uL (ref 1500–7800)
Neutrophils Relative %: 66 %
PLATELETS: 276 10*3/uL (ref 140–400)
RBC: 4.19 MIL/uL (ref 3.80–5.10)
RDW: 14.1 % (ref 11.0–15.0)
Rh Type: POSITIVE
Rubella: 5.93 Index — ABNORMAL HIGH (ref <0.90)
WBC: 5.9 10*3/uL (ref 3.8–10.8)

## 2016-08-27 LAB — CYTOLOGY - PAP
DIAGNOSIS: NEGATIVE
HPV: NOT DETECTED

## 2016-08-27 LAB — CERVICOVAGINAL ANCILLARY ONLY
CHLAMYDIA, DNA PROBE: NEGATIVE
Neisseria Gonorrhea: NEGATIVE

## 2016-08-27 LAB — HIV ANTIBODY (ROUTINE TESTING W REFLEX): HIV 1&2 Ab, 4th Generation: NONREACTIVE

## 2016-08-27 LAB — SICKLE CELL SCREEN: Sickle Cell Screen: NEGATIVE

## 2016-08-27 LAB — CULTURE, OB URINE: ORGANISM ID, BACTERIA: NO GROWTH

## 2016-08-27 MED ORDER — MICONAZOLE NITRATE 2 % VA CREA: 1.0000 | TOPICAL_CREAM | Freq: Every day | VAGINAL | 0 refills | Status: AC

## 2016-08-28 ENCOUNTER — Telehealth: Payer: Self-pay

## 2016-08-28 NOTE — Telephone Encounter (Signed)
-----   Message from Pincus LargeJazma Y Phelps, DO sent at 08/27/2016  1:39 PM EST ----- [facility] please call pt and let her know that her swab showed that she had a yeast infection. I will be sending in a treatment that she should use to clear the infection. Follow the instructions.  Thanks! Pincus LargeJazma Y Phelps, DO

## 2016-08-28 NOTE — Telephone Encounter (Signed)
Using [employer] ID #[id number]- Left message for pt to call back to discuss results.

## 2016-08-31 ENCOUNTER — Telehealth: Payer: Self-pay | Admitting: Student

## 2016-08-31 MED ORDER — ONDANSETRON HCL 4 MG PO TABS: 4.0000 mg | ORAL_TABLET | Freq: Three times a day (TID) | ORAL | 1 refills | Status: DC | PRN

## 2016-08-31 NOTE — Telephone Encounter (Signed)
Pt called and would like to have her nausea medication [facility] in to [facility] [address] jw

## 2016-09-04 ENCOUNTER — Encounter: Payer: Self-pay | Admitting: Internal Medicine

## 2016-09-23 ENCOUNTER — Encounter (HOSPITAL_COMMUNITY): Payer: Self-pay

## 2016-09-23 ENCOUNTER — Other Ambulatory Visit (HOSPITAL_COMMUNITY): Payer: Self-pay | Admitting: *Deleted

## 2016-09-23 ENCOUNTER — Ambulatory Visit (HOSPITAL_COMMUNITY)
Admission: RE | Admit: 2016-09-23 | Discharge: 2016-09-23 | Disposition: A | Discharge status: Home / Self Care | Payer: Medicaid Other | Source: Ambulatory Visit | Attending: Family Medicine | Admitting: Family Medicine

## 2016-09-23 ENCOUNTER — Other Ambulatory Visit: Payer: Self-pay | Admitting: Obstetrics and Gynecology

## 2016-09-23 DIAGNOSIS — Z3682 Encounter for antenatal screening for nuchal translucency: Secondary | ICD-10-CM | POA: Diagnosis not present

## 2016-09-23 DIAGNOSIS — O09522 Supervision of elderly multigravida, second trimester: Secondary | ICD-10-CM

## 2016-09-23 DIAGNOSIS — O09291 Supervision of pregnancy with other poor reproductive or obstetric history, first trimester: Secondary | ICD-10-CM | POA: Insufficient documentation

## 2016-09-23 DIAGNOSIS — O09521 Supervision of elderly multigravida, first trimester: Secondary | ICD-10-CM | POA: Insufficient documentation

## 2016-09-23 DIAGNOSIS — Z3A13 13 weeks gestation of pregnancy: Secondary | ICD-10-CM | POA: Diagnosis not present

## 2016-09-23 DIAGNOSIS — Z3482 Encounter for supervision of other normal pregnancy, second trimester: Secondary | ICD-10-CM

## 2016-09-24 ENCOUNTER — Inpatient Hospital Stay (HOSPITAL_COMMUNITY): Payer: Medicaid Other

## 2016-09-24 ENCOUNTER — Inpatient Hospital Stay (HOSPITAL_COMMUNITY)
Admission: AD | Admit: 2016-09-24 | Discharge: 2016-09-24 | Disposition: A | Discharge status: Home / Self Care | Payer: Medicaid Other | Source: Ambulatory Visit | Attending: Obstetrics & Gynecology | Admitting: Obstetrics & Gynecology

## 2016-09-24 ENCOUNTER — Telehealth: Payer: Self-pay | Admitting: Advanced Practice Midwife

## 2016-09-24 ENCOUNTER — Encounter (HOSPITAL_COMMUNITY): Payer: Self-pay | Admitting: *Deleted

## 2016-09-24 DIAGNOSIS — O09299 Supervision of pregnancy with other poor reproductive or obstetric history, unspecified trimester: Secondary | ICD-10-CM | POA: Diagnosis not present

## 2016-09-24 DIAGNOSIS — O09522 Supervision of elderly multigravida, second trimester: Secondary | ICD-10-CM | POA: Insufficient documentation

## 2016-09-24 DIAGNOSIS — O4692 Antepartum hemorrhage, unspecified, second trimester: Secondary | ICD-10-CM | POA: Insufficient documentation

## 2016-09-24 DIAGNOSIS — O200 Threatened abortion: Secondary | ICD-10-CM

## 2016-09-24 DIAGNOSIS — Z3A14 14 weeks gestation of pregnancy: Secondary | ICD-10-CM | POA: Diagnosis not present

## 2016-09-24 DIAGNOSIS — O99332 Smoking (tobacco) complicating pregnancy, second trimester: Secondary | ICD-10-CM | POA: Insufficient documentation

## 2016-09-24 DIAGNOSIS — O209 Hemorrhage in early pregnancy, unspecified: Secondary | ICD-10-CM | POA: Diagnosis present

## 2016-09-24 DIAGNOSIS — O4592 Premature separation of placenta, unspecified, second trimester: Secondary | ICD-10-CM

## 2016-09-24 LAB — CBC
HCT: 31.7 % — ABNORMAL LOW (ref 36.0–46.0)
Hemoglobin: 10.7 g/dL — ABNORMAL LOW (ref 12.0–15.0)
MCH: 26.5 pg (ref 26.0–34.0)
MCHC: 33.8 g/dL (ref 30.0–36.0)
MCV: 78.5 fL (ref 78.0–100.0)
PLATELETS: 246 10*3/uL (ref 150–400)
RBC: 4.04 MIL/uL (ref 3.87–5.11)
RDW: 14.2 % (ref 11.5–15.5)
WBC: 5.7 10*3/uL (ref 4.0–10.5)

## 2016-09-24 MED ORDER — PRENATAL 19 PO TABS: 1.0000 | ORAL_TABLET | Freq: Every day | ORAL | 6 refills | Status: DC

## 2016-09-24 NOTE — Telephone Encounter (Signed)
[facility] to establish care for high risk pregnancy.

## 2016-09-24 NOTE — MAU Provider Note (Signed)
Chief Complaint: Vaginal Bleeding   First Provider Initiated Contact with Patient 09/24/16 [phone]      SUBJECTIVE HPI: Cheryl Bauer is a 41 y.o. [id number] at [redacted]w[redacted]d by LMP who presents to maternity admissions reporting onset of heavy vaginal bleeding with clots this morning.  This is a new symptom. She had a normal Korea yesterday which was supposed to be her anatomy US but her dating was changed and a nuchal translucency Korea was done instead, making her 14 weeks by Korea yesterday.  The bleeding is worse when standing, improved with lying down.  It is associated with back pain, which started before the bleeding but was intermittent and is now constant since onset of bleeding.   She denies vaginal itching/burning, urinary symptoms, h/a, dizziness, n/v, or fever/chills.     HPI  Past Medical History:  Diagnosis Date  . Headache    not specified by pt. - occasional  . 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 CESAREAN SECTION    . STAPEDECTOMY Right 12/04/2015   Procedure: RIGHT SIDE TYMPANOTOMY EXPLORATORY WITH STAPEDECTOMY;  Surgeon: Cheryl Colonel, MD;  Location: [facility];  Service: ENT;  Laterality: Right;   Social History   Social History  . Marital status: Married    Spouse name: N/A  . Number of children: N/A  . Years of education: N/A   Occupational History  . Not on file.   Social History Main Topics  . Smoking status: Current Some Day Smoker    Types: E-cigarettes  . Smokeless tobacco: Never Used     Comment: [place]  . Alcohol use No  . Drug use: No  . Sexual activity: Yes    Birth control/ protection: None   Other Topics Concern  . Not on file   Social History Narrative  . No narrative on file   No current facility-administered medications on file prior to encounter.    Current Outpatient Prescriptions on File Prior to Encounter  Medication Sig Dispense Refill  . acetaminophen (TYLENOL) 325 MG tablet Take  650 mg by mouth every 6 (six) hours as needed.    . cyclobenzaprine (FLEXERIL) 5 MG tablet Take 1 tablet (5 mg total) by mouth 3 (three) times daily as needed for muscle spasms. (Patient not taking: Reported on 09/23/2016) 30 tablet 1  . meloxicam (MOBIC) 7.5 MG tablet Take 1 tablet (7.5 mg total) by mouth daily. (Patient not taking: Reported on 09/23/2016) 30 tablet 0  . ondansetron (ZOFRAN) 4 MG tablet Take 1 tablet (4 mg total) by mouth every 8 (eight) hours as needed for nausea or vomiting. (Patient not taking: Reported on 09/24/2016) 30 tablet 1  . promethazine (PHENERGAN) 25 MG suppository Place 1 suppository (25 mg total) rectally every 6 (six) hours as needed for nausea or vomiting. (Patient not taking: Reported on 09/23/2016) 12 suppository 1   No Known Allergies  ROS:  Review of Systems  Constitutional: Negative for chills, fatigue and fever.  Respiratory: Negative for shortness of breath.   Cardiovascular: Negative for chest pain.  Gastrointestinal: Negative for nausea and vomiting.  Genitourinary: Positive for vaginal bleeding. Negative for difficulty urinating, dysuria, flank pain, pelvic pain, vaginal discharge and vaginal pain.  Musculoskeletal: Positive for back pain.  Neurological: Negative for dizziness and headaches.  Psychiatric/Behavioral: Negative.      I have reviewed patient's Past Medical Hx, Surgical Hx, Family Hx, Social Hx, medications and allergies.   Physical  Exam   Patient Vitals for the past 24 hrs:  BP Temp Temp src Pulse Resp  09/24/16 0751 130/76 98.6 F (37 C) Oral 93 20   Constitutional: Well-developed, well-nourished female in no acute distress.  Cardiovascular: normal rate Respiratory: normal effort GI: Abd soft, non-tender. Pos BS x 4 MS: Extremities nontender, no edema, normal ROM Neurologic: Alert and oriented x 4.  GU: Neg CVAT.  PELVIC EXAM: Cervix pink, visually closed, without lesion, large amount of watery bleeding without clots pooling  in vaginal vault, vaginal walls and external genitalia normal  FHT 156 by doppler  LAB RESULTS Results for orders placed or performed during the hospital encounter of 09/24/16 (from the past 24 hour(s))  CBC     Status: Abnormal   Collection Time: 09/24/16 11:06 AM  Result Value Ref Range   WBC 5.7 4.0 - 10.5 K/uL   RBC 4.04 3.87 - 5.11 MIL/uL   Hemoglobin 10.7 (L) 12.0 - 15.0 g/dL   HCT 16.1 (L) 09.6 - 04.5 %   MCV 78.5 78.0 - 100.0 fL   MCH 26.5 26.0 - 34.0 pg   MCHC 33.8 30.0 - 36.0 g/dL   RDW 40.9 81.1 - 91.4 %   Platelets 246 150 - 400 K/uL    A/POS/-- (01/31 1103)  IMAGING [place] Mfm Fetal Nuchal Translucency  Result Date: 09/23/2016 ----------------------------------------------------------------------  OBSTETRICS REPORT                       (Signed Final 09/23/2016 11:46 pm) ---------------------------------------------------------------------- Patient Info  ID #:       [id number]                          D.O.B.:  Mar 20, 1976 (41 yrs)  Name:       Cheryl Bauer                 Visit Date: 09/23/2016 11:32 am ---------------------------------------------------------------------- Performed By  Performed By:     Georgeanna Lea       Ref. Address:      [facility]                                                              Med[facility] Center                                                              [address]  [place], [place]                                                              [phone]  Attending:        Particia NearingMartha Decker MD       [facility]  Referred By:      Janit PaganKehinde Eniola MD ---------------------------------------------------------------------- Orders   #  Description                                 Code   1  US MFM FETAL NUCHAL                         [id number]      TRANSLUCENCY   ----------------------------------------------------------------------   #  Ordered By               Order #        Accession #    Episode #   1  Janit PaganKEHINDE ENIOLA           [id number]      [id number]     [id number]  ---------------------------------------------------------------------- Indications   [redacted] weeks gestation of pregnancy                Z3A.13   Poor obstetric history: Previous neonatal      O09.299   death   Advanced maternal age multigravida 35+         66O09.521   48(39), first trimester   Encounter for nuchal translucency              Z36.82  ---------------------------------------------------------------------- OB History  Blood Type:            Height:  5'4"   Weight (lb):  168.4     BMI:  28.9  Gravidity:    6         Term:   5  Living:       4 ---------------------------------------------------------------------- Fetal Evaluation  Num Of Fetuses:     1  Fetal Heart         162  Rate(bpm):  Cardiac Activity:   Observed  Presentation:       Vertex  Placenta:           Posterior, above cervical os  Amniotic Fluid  AFI FV:      Subjectively within normal limits                              Largest Pocket(cm)                              3.4 ---------------------------------------------------------------------- Biometry  BPD:      24.7  mm     G. Age:  14w 2d                  CI:         77.22  %    70 - 86  FL/HC:       12.1  %  HC:         89  mm     G. Age:  14w 0d                  HC/AC:       1.20       1.14 - 1.31  AC:       74.1  mm     G. Age:  13w 6d                  FL/BPD:      43.7  %  FL:       10.8  mm     G. Age:  13w 2d                  FL/AC:       14.6  %    20 - 24  Est. FW:      79   gm     0 lb 3 oz ---------------------------------------------------------------------- Gestational Age  LMP:           18w 0d        Date:  05/20/16                 EDD:    02/24/17  U/S Today:     13w 6d                                        EDD:     03/25/17  Best:          13w 6d     Det. By:  U/S (09/23/16)           EDD:    03/25/17 ---------------------------------------------------------------------- Cervix Uterus Adnexa  Cervix  Length:              4  cm.  Normal appearance by transabdominal scan. ---------------------------------------------------------------------- Myomas   Site                     L(cm)      W(cm)      D(cm)       Location   Anterior                 1.3        0.8        1.1         Subserosal  ----------------------------------------------------------------------   Blood Flow                 RI        PI       Comments  ---------------------------------------------------------------------- Impression  SIUP at 13+6 weeks  No gross abnormalities identified  Normal amniotic fluid volume  EDC based on today's measurements: 03/25/17  Ms. Marcantel declined genetic counseling and aneuploidy testing. ---------------------------------------------------------------------- Recommendations  Offer MSAFP in the second trimester for ONTD screening  Offer detailed anatomy U/S by 18 weeks ----------------------------------------------------------------------                 Particia Nearing, MD Electronically Signed Final Report   09/23/2016 11:46 pm ----------------------------------------------------------------------  [place] Mfm Ob Limited  Result Date: 09/24/2016 ----------------------------------------------------------------------  OBSTETRICS REPORT                      (  Signed Final 09/24/2016 10:27 am) ---------------------------------------------------------------------- Patient Info  ID #:       [id number]                          D.O.B.:  Apr 14, 1976 (41 yrs)  Name:       Cheryl Halsted A Graef                 Visit Date: 09/24/2016 09:22 am ---------------------------------------------------------------------- Performed By  Performed By:     Eden Lathe BS      Ref. Address:     [facility]                    [facility]                                                              Medicine Center                                                             [address] [place], [place]                                                             [address]  Attending:        Charlsie Merles MD         Secondary Phy.:   MAU Nursing-                                                             MAU/Triage  Referred By:      Janit Pagan MD      Location:         [facility] ---------------------------------------------------------------------- Orders   #  Description                                 Code   1  [place] MFM OB LIMITED  [address].60  ----------------------------------------------------------------------   #  Ordered By               Order #        Accession #    Episode #   1  Jaimen Melone LEFTWICH-           [id number]      [id number]     [id number]      KIRBY  ---------------------------------------------------------------------- Indications   [redacted] weeks gestation of pregnancy                Z3A.14   Poor obstetric history: Previous neonatal      O09.299   death   Advanced maternal age multigravida 24+,        O5.522   second trimester   Vaginal bleeding in pregnancy, second          O46.92   trimester  ---------------------------------------------------------------------- OB History  Blood Type:            Height:  5'4"   Weight (lb):  168.4     BMI:  28.9  Gravidity:    6         Term:   5  Living:       4 ---------------------------------------------------------------------- Fetal Evaluation  Num Of Fetuses:     1  Cardiac Activity:   Observed  Presentation:       Variable  Placenta:           Posterior  Amniotic Fluid  AFI FV:      Subjectively within normal limits                              Largest Pocket(cm)                              5.3 ---------------------------------------------------------------------- Gestational Age   LMP:           18w 1d        Date:  05/20/16                 EDD:   02/24/17  Best:          14w 0d     Det. By:  U/S  (09/23/16)          EDD:   03/25/17 ---------------------------------------------------------------------- Cervix Uterus Adnexa  Cervix  Closed  Uterus  No abnormality visualized.  Left Ovary  Not visualized.  Right Ovary  Within normal limits.  Cul De Sac:   No free fluid seen.  Adnexa:       No abnormality visualized. ---------------------------------------------------------------------- Impression  IUP at 14+0 weeks with vaginal bleeding  Had full US done yesterday; today there is evidence of  placental separation and bleeding along the inferior margin of  the placenta, as described above.  Normal fetal movement and cardiac activity  Normal amniotic fluid ---------------------------------------------------------------------- Recommendations  Continue observation for worsening bleeding ----------------------------------------------------------------------                 Charlsie Merles, MD Electronically Signed Final Report   09/24/2016 10:27 am ----------------------------------------------------------------------   MAU Management/MDM: Ordered labs and Korea and reviewed results.  Korea indicates partial abruption of placenta, this is theatened miscarriage at this time with normal FHR today.  Outpatient Korea in 1 week.  Bleeding precautions reviewed/reasons for pt to return to MAU.  Pt to continue PNV daily.  Hgb stable today.  Pt stable at time of discharge.  ASSESSMENT 1. Threatened miscarriage   2. Vaginal bleeding in pregnancy, second trimester   3. [redacted] weeks gestation of pregnancy   4. Placental separation, antepartum, second trimester     PLAN Discharge home with bleeding precautions  Allergies as of 09/24/2016   No Known Allergies     Medication List    STOP taking these medications   cyclobenzaprine 5 MG tablet Commonly known as:  FLEXERIL   meloxicam 7.5 MG tablet Commonly  known as:  MOBIC   ondansetron 4 MG tablet Commonly known as:  ZOFRAN   promethazine 25 MG suppository Commonly known as:  PHENERGAN     TAKE these medications   PRENATAL 19 tablet Take 1 tablet by mouth daily.   TYLENOL 325 MG tablet Generic drug:  acetaminophen Take 650 mg by mouth every 6 (six) hours as needed.      Follow-up Information    [facility] Follow up.   Why:  As scheduled, return to MAU as needed for emergencies Contact information: [address] [place] [address] [phone]       [facility] Follow up.   Specialty:  Maternal and Fetal Medicine Why:  MFM will call you with ultrasound appointment in 1 week Contact information: [address] [id number] [place] [address] [phone]          Sharen Counter Certified Nurse-Midwife 09/24/2016  11:51 AM

## 2016-09-24 NOTE — MAU Note (Signed)
Pt reports she was here yesterday for her anatomy scan. Everything was ok. Woke up this morning and started having some vaginal bleeding like a period. Denies pain at this time.

## 2016-09-29 ENCOUNTER — Ambulatory Visit (INDEPENDENT_AMBULATORY_CARE_PROVIDER_SITE_OTHER): Payer: Medicaid Other | Admitting: Obstetrics and Gynecology

## 2016-09-29 VITALS — BP 110/70 | HR 102 | Temp 98.1°F | Wt 168.0 lb

## 2016-09-29 DIAGNOSIS — K5900 Constipation, unspecified: Secondary | ICD-10-CM

## 2016-09-29 DIAGNOSIS — O09522 Supervision of elderly multigravida, second trimester: Secondary | ICD-10-CM

## 2016-09-29 DIAGNOSIS — G44209 Tension-type headache, unspecified, not intractable: Secondary | ICD-10-CM | POA: Diagnosis not present

## 2016-09-29 DIAGNOSIS — O4692 Antepartum hemorrhage, unspecified, second trimester: Secondary | ICD-10-CM | POA: Diagnosis not present

## 2016-09-29 DIAGNOSIS — Z3482 Encounter for supervision of other normal pregnancy, second trimester: Secondary | ICD-10-CM

## 2016-09-29 DIAGNOSIS — O09529 Supervision of elderly multigravida, unspecified trimester: Secondary | ICD-10-CM | POA: Insufficient documentation

## 2016-09-29 DIAGNOSIS — R350 Frequency of micturition: Secondary | ICD-10-CM | POA: Diagnosis not present

## 2016-09-29 DIAGNOSIS — O0992 Supervision of high risk pregnancy, unspecified, second trimester: Secondary | ICD-10-CM | POA: Diagnosis not present

## 2016-09-29 LAB — POCT URINALYSIS DIPSTICK
Bilirubin, UA: NEGATIVE
Glucose, UA: NEGATIVE
Ketones, UA: NEGATIVE
LEUKOCYTES UA: NEGATIVE
Nitrite, UA: NEGATIVE
PH UA: 6
PROTEIN UA: NEGATIVE
SPEC GRAV UA: 1.015
UROBILINOGEN UA: 0.2

## 2016-09-29 LAB — POCT UA - MICROSCOPIC ONLY

## 2016-09-29 MED ORDER — POLYETHYLENE GLYCOL 3350 17 GM/SCOOP PO POWD: 17.0000 g | Freq: Two times a day (BID) | ORAL | 1 refills | Status: DC | PRN

## 2016-09-29 NOTE — Patient Instructions (Signed)

## 2016-09-29 NOTE — Progress Notes (Signed)
Cheryl Bauer is a 41 y.o. [id number] at 7165w5d for routine follow up.  She reports bleeding, headache, occasional contractions and urinary frequency. Taking PNV.   Went to the MAU on 09/24/16 for vaginal bleeding. Was noted to have separation of placenta and at increased risk for miscarriage. Continues to endorse some spotting but it has improved.   Having constipation. No BM in last 4 days. Wants something to helps  Urinary frequency with some back and lower abdomen cramping. Only on left side. Endorsing some dysuria. Denies fevers.  See flow sheet for details.  A/P: Pregnancy at 8765w5d.  Doing well.  Pregnancy progressing well.  Pregnancy issues include:  1. Urinary frequency UA unremarkable. Does have moderate blood but no signs of cystitis. Will send urine off for culture. Microscopic UA showed yeast. Will give Rx for vaginal antifungal suppository.  - Urinalysis Dipstick - POCT UA - Microscopic Only - Culture, OB Urine  2. AMA multigravida in second trimester Patient declined genetic testing. Order placed for genetic counseling with MFM. Will not be 40yo at time of delivery so no need for HROB.  - AMB referral to Maternal Fetal Medicine (MFM)  3. Encounter for supervision of other normal pregnancy in second trimester Continue routine prenatal care. Previous c/s x3. Will need to schedule appointment with Dr. Shawnie PonsPratt to discuss repeat c/s. See overview for additional details.  - Culture, OB Urine  4. Constipation, unspecified constipation type Rx for Miralax.   5. Tension headache Continues to have intermittent recurring tension headaches. No red flags. Patient counseled to continue using Tylenol as needed for headaches. She should avoid ASA and ibuprofen products.  6. Vaginal bleeding in pregnancy, second trimester Patient at increased risk for threatened miscarriage. Continues to have vaginal spotting. States that it has improved. Patient has follow-up appointment on Thursday for  subsequent ultrasound and follow-up. Emergency care discussed.   Anatomy ultrasound ordered to be scheduled at 18-19 weeks. Pt  is not interested in genetic screening. Bleeding and pain precautions reviewed. Follow up 4 weeks.  [name], DO 09/29/2016, 10:08 AM PGY-3, [facility]

## 2016-09-30 ENCOUNTER — Other Ambulatory Visit: Payer: Self-pay | Admitting: Student

## 2016-09-30 LAB — CULTURE, OB URINE: Organism ID, Bacteria: NO GROWTH

## 2016-09-30 MED ORDER — POLYETHYLENE GLYCOL 3350 17 GM/SCOOP PO POWD: 17.0000 g | Freq: Two times a day (BID) | ORAL | 1 refills | Status: DC | PRN

## 2016-09-30 NOTE — Telephone Encounter (Signed)
Miralax sent to [facility] on [address] per patient request.  Clovis PuMartin, Tamika L, RN

## 2016-09-30 NOTE — Telephone Encounter (Signed)
Husband states medication was called into the wrong pharmacy. [facility] [address] ep

## 2016-10-01 ENCOUNTER — Encounter: Payer: Self-pay | Admitting: Obstetrics and Gynecology

## 2016-10-01 ENCOUNTER — Ambulatory Visit (INDEPENDENT_AMBULATORY_CARE_PROVIDER_SITE_OTHER): Payer: Medicaid Other | Admitting: Obstetrics and Gynecology

## 2016-10-01 VITALS — BP 105/56 | HR 97 | Wt 167.7 lb

## 2016-10-01 DIAGNOSIS — O4692 Antepartum hemorrhage, unspecified, second trimester: Secondary | ICD-10-CM | POA: Diagnosis not present

## 2016-10-01 DIAGNOSIS — O34219 Maternal care for unspecified type scar from previous cesarean delivery: Secondary | ICD-10-CM | POA: Diagnosis not present

## 2016-10-01 DIAGNOSIS — O09522 Supervision of elderly multigravida, second trimester: Secondary | ICD-10-CM | POA: Diagnosis not present

## 2016-10-01 DIAGNOSIS — O0992 Supervision of high risk pregnancy, unspecified, second trimester: Secondary | ICD-10-CM | POA: Diagnosis not present

## 2016-10-01 DIAGNOSIS — Z789 Other specified health status: Secondary | ICD-10-CM

## 2016-10-01 LAB — POCT URINALYSIS DIP (DEVICE)
BILIRUBIN URINE: NEGATIVE
Glucose, UA: NEGATIVE mg/dL
Ketones, ur: NEGATIVE mg/dL
Leukocytes, UA: NEGATIVE
NITRITE: NEGATIVE
PH: 5.5 (ref 5.0–8.0)
PROTEIN: NEGATIVE mg/dL
Specific Gravity, Urine: 1.02 (ref 1.005–1.030)
UROBILINOGEN UA: 0.2 mg/dL (ref 0.0–1.0)

## 2016-10-01 NOTE — Progress Notes (Signed)
Pt c/o constipation x6 days

## 2016-10-01 NOTE — Progress Notes (Signed)
Prenatal Visit Note Date: 10/01/2016 Clinic: [facility]  Subjective:  Cheryl Bauer is a 41 y.o. [id number] at 71w0dbeing seen today for ongoing prenatal care.  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  Patient reports no complaints.    . Vag. Bleeding: None.  Movement: Present. Denies leaking of fluid.   The following portions of the patient's history were reviewed and updated as appropriate: allergies, current medications, past family history, past medical history, past social history, past surgical history and problem list. Problem list updated.  Objective:   Vitals:   10/01/16 0820  BP: (!) 105/56  Pulse: 97  Weight: 167 lb 11.2 oz (76.1 kg)    Fetal Status: Fetal Heart Rate (bpm): 168   Movement: Present     General:  Alert, oriented and cooperative. Patient is in no acute distress.  Skin: Skin is warm and dry. No rash noted.   Cardiovascular: Normal heart rate noted  Respiratory: Normal respiratory effort, no problems with respiration noted  Abdomen: Soft, gravid, appropriate for gestational age. Pain/Pressure: Present     Pelvic:  Cervical exam deferred        Extremities: Normal range of motion.  Edema: None  Mental Status: Normal mood and affect. Normal behavior. Normal judgment and thought content.   Urinalysis:      Assessment and Plan:  Pregnancy: [id number] 131w0d1. Supervision of high risk pregnancy in second trimester [facility] findings of some placenta separation. Rh pos, normal CL at that u/s. D/w pt that since no current s/s and nothing to do to intervene  since so early, to just do exp management. Will recommend not doing baby ASA given possible bleeding risk. baselien pre-x labs and early gdm screening today - [place] OB COMP + 142K; Future - Comp Met (CMET) - Protein / Creatinine Ratio, Urine - Hemoglobin A1c  2. Vaginal bleeding in pregnancy, second trimester See above  3. Language barrier Interpreter used  4. History of cesarean delivery, currently pregnant X3.d/w her re: rpt and BTL later in pregnancy  5. Elderly multigravida in second trimester Declined GC and genetic testing  Preterm labor symptoms and general obstetric precautions including but not limited to vaginal bleeding, contractions, leaking of fluid and fetal movement were reviewed in detail with the patient. Please refer to After Visit Summary for other counseling recommendations.  Return in about 2 weeks (around 10/15/2016) for rob.   ChAletha HalimMD

## 2016-10-02 DIAGNOSIS — M542 Cervicalgia: Secondary | ICD-10-CM | POA: Insufficient documentation

## 2016-10-02 LAB — HEMOGLOBIN A1C
Est. average glucose Bld gHb Est-mCnc: 97 mg/dL
Hgb A1c MFr Bld: 5 % (ref 4.8–5.6)

## 2016-10-02 LAB — PROTEIN / CREATININE RATIO, URINE

## 2016-10-02 LAB — COMPREHENSIVE METABOLIC PANEL
ALBUMIN: 4.1 g/dL (ref 3.5–5.5)
ALT: 11 IU/L (ref 0–32)
AST: 16 IU/L (ref 0–40)
Albumin/Globulin Ratio: 1.5 (ref 1.2–2.2)
Alkaline Phosphatase: 61 IU/L (ref 39–117)
BUN / CREAT RATIO: 13 (ref 9–23)
BUN: 6 mg/dL (ref 6–20)
CO2: 22 mmol/L (ref 18–29)
Calcium: 9.8 mg/dL (ref 8.7–10.2)
Chloride: 97 mmol/L (ref 96–106)
Creatinine, Ser: 0.48 mg/dL — ABNORMAL LOW (ref 0.57–1.00)
GFR calc non Af Amer: 124 mL/min/{1.73_m2} (ref >59)
GFR, EST AFRICAN AMERICAN: 143 mL/min/{1.73_m2} (ref >59)
GLOBULIN, TOTAL: 2.8 g/dL (ref 1.5–4.5)
GLUCOSE: 74 mg/dL (ref 65–99)
Potassium: 3.9 mmol/L (ref 3.5–5.2)
SODIUM: 136 mmol/L (ref 134–144)
TOTAL PROTEIN: 6.9 g/dL (ref 6.0–8.5)

## 2016-10-02 NOTE — Addendum Note (Signed)
Addended by: Garret ReddishBARNES, Uel Davidow M on: 10/02/2016 11:24 AM   Modules accepted: Orders

## 2016-10-15 ENCOUNTER — Encounter: Payer: Self-pay | Admitting: Advanced Practice Midwife

## 2016-10-15 ENCOUNTER — Ambulatory Visit (INDEPENDENT_AMBULATORY_CARE_PROVIDER_SITE_OTHER): Payer: Medicaid Other | Admitting: Advanced Practice Midwife

## 2016-10-15 VITALS — BP 112/70 | HR 92

## 2016-10-15 DIAGNOSIS — O09292 Supervision of pregnancy with other poor reproductive or obstetric history, second trimester: Secondary | ICD-10-CM | POA: Diagnosis not present

## 2016-10-15 DIAGNOSIS — Z789 Other specified health status: Secondary | ICD-10-CM

## 2016-10-15 DIAGNOSIS — O09299 Supervision of pregnancy with other poor reproductive or obstetric history, unspecified trimester: Secondary | ICD-10-CM

## 2016-10-15 DIAGNOSIS — O0992 Supervision of high risk pregnancy, unspecified, second trimester: Secondary | ICD-10-CM

## 2016-10-15 DIAGNOSIS — Z603 Acculturation difficulty: Secondary | ICD-10-CM

## 2016-10-15 NOTE — Patient Instructions (Signed)
Second Trimester of Pregnancy The second trimester is from week 13 through week 28, month 4 through 6. This is often the time in pregnancy that you feel your best. Often times, morning sickness has lessened or quit. You may have more energy, and you may get hungry more often. Your unborn baby (fetus) is growing rapidly. At the end of the sixth month, he or she is about 9 inches long and weighs about 1 pounds. You will likely feel the baby move (quickening) between 18 and 20 weeks of pregnancy. Follow these instructions at home:  Avoid all smoking, herbs, and alcohol. Avoid drugs not approved by your doctor.  Do not use any tobacco products, including cigarettes, chewing tobacco, and electronic cigarettes. If you need help quitting, ask your doctor. You may get counseling or other support to help you quit.  Only take medicine as told by your doctor. Some medicines are safe and some are not during pregnancy.  Exercise only as told by your doctor. Stop exercising if you start having cramps.  Eat regular, healthy meals.  Wear a good support bra if your breasts are tender.  Do not use hot tubs, steam rooms, or saunas.  Wear your seat belt when driving.  Avoid raw meat, uncooked cheese, and liter boxes and soil used by cats.  Take your prenatal vitamins.  Take 1500-2000 milligrams of calcium daily starting at the 20th week of pregnancy until you deliver your baby.  Try taking medicine that helps you poop (stool softener) as needed, and if your doctor approves. Eat more fiber by eating fresh fruit, vegetables, and whole grains. Drink enough fluids to keep your pee (urine) clear or pale yellow.  Take warm water baths (sitz baths) to soothe pain or discomfort caused by hemorrhoids. Use hemorrhoid cream if your doctor approves.  If you have puffy, bulging veins (varicose veins), wear support hose. Raise (elevate) your feet for 15 minutes, 3-4 times a day. Limit salt in your diet.  Avoid heavy  lifting, wear low heals, and sit up straight.  Rest with your legs raised if you have leg cramps or low back pain.  Visit your dentist if you have not gone during your pregnancy. Use a soft toothbrush to brush your teeth. Be gentle when you floss.  You can have sex (intercourse) unless your doctor tells you not to.  Go to your doctor visits. Get help if:  You feel dizzy.  You have mild cramps or pressure in your lower belly (abdomen).  You have a nagging pain in your belly area.  You continue to feel sick to your stomach (nauseous), throw up (vomit), or have watery poop (diarrhea).  You have bad smelling fluid coming from your vagina.  You have pain with peeing (urination). Get help right away if:  You have a fever.  You are leaking fluid from your vagina.  You have spotting or bleeding from your vagina.  You have severe belly cramping or pain.  You lose or gain weight rapidly.  You have trouble catching your breath and have chest pain.  You notice sudden or extreme puffiness (swelling) of your face, hands, ankles, feet, or legs.  You have not felt the baby move in over an hour.  You have severe headaches that do not go away with medicine.  You have vision changes. This information is not intended to replace advice given to you by your health care provider. Make sure you discuss any questions you have with your health care   provider. Document Released: 10/07/2009 Document Revised: 12/19/2015 Document Reviewed: 09/13/2012 Elsevier Interactive Patient Education  2017 Elsevier Inc.  

## 2016-10-15 NOTE — Progress Notes (Signed)
Interpreter Cheryl Bauer

## 2016-10-15 NOTE — Progress Notes (Signed)
   PRENATAL VISIT NOTE  Subjective:  Cheryl Bauer is a 41 y.o. G6P5004 at 301w0d being seen today for ongoing prenatal care.  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  Patient reports Round ligament pain, no further bleeding.  Contractions: Not present. Vag. Bleeding: None.  Movement: Present. Denies leaking of fluid.   The following portions of the patient's history were reviewed and updated as appropriate: allergies, current medications, past family history, past medical history, past social history, past surgical history and problem list. Problem list updated.  In person interpretor used  Objective:   Vitals:   10/15/16 0924  BP: 112/70  Pulse: 92    Fetal Status: Fetal Heart Rate (bpm): 155   Movement: Present     General:  Alert, oriented and cooperative. Patient is in no acute distress.  Skin: Skin is warm and dry. No rash noted.   Cardiovascular: Normal heart rate noted  Respiratory: Normal respiratory effort, no problems with respiration noted  Abdomen: Soft, gravid, appropriate for gestational age. Pain/Pressure: Absent     Pelvic:  Cervical exam deferred        Extremities: Normal range of motion.  Edema: None  Mental Status: Normal mood and affect. Normal behavior. Normal judgment and thought content.   Assessment and Plan:  Pregnancy: G6P5004 at 751w0d  1. Supervision of high risk pregnancy in second trimester     Declines Quad Screen or AFP, declined First Trimester screen -  AFP/Quad Scr  2. Neonatal death in prior pregnancy, currently pregnant   Preterm labor symptoms and general obstetric precautions including but not limited to vaginal bleeding, contractions, leaking of fluid and fetal movement were reviewed in detail with the patient. Please refer to After Visit Summary for other counseling recommendations.   Return in about 4 weeks (around 11/12/2016) for High Risk Clinic.   Cheryl Bauer, CNM

## 2016-11-02 ENCOUNTER — Ambulatory Visit (HOSPITAL_COMMUNITY)
Admission: RE | Admit: 2016-11-02 | Discharge: 2016-11-02 | Disposition: A | Discharge status: Home / Self Care | Payer: Medicaid Other | Source: Ambulatory Visit | Attending: Family Medicine | Admitting: Family Medicine

## 2016-11-02 ENCOUNTER — Encounter (HOSPITAL_COMMUNITY): Payer: Self-pay

## 2016-11-02 DIAGNOSIS — O34219 Maternal care for unspecified type scar from previous cesarean delivery: Secondary | ICD-10-CM | POA: Diagnosis not present

## 2016-11-02 DIAGNOSIS — O09522 Supervision of elderly multigravida, second trimester: Secondary | ICD-10-CM | POA: Diagnosis not present

## 2016-11-02 DIAGNOSIS — Z36 Encounter for antenatal screening for chromosomal anomalies: Secondary | ICD-10-CM | POA: Insufficient documentation

## 2016-11-02 DIAGNOSIS — Z3A19 19 weeks gestation of pregnancy: Secondary | ICD-10-CM | POA: Diagnosis not present

## 2016-11-03 ENCOUNTER — Encounter: Payer: Medicaid Other | Admitting: Student

## 2016-11-03 ENCOUNTER — Telehealth: Payer: Self-pay | Admitting: Student

## 2016-11-03 NOTE — Telephone Encounter (Signed)
Please call the patient. It seems she has an OB appointment this afternoon on my schedule, but her pregnancy is being managed by high risk OB. Please clarify with the patient that the [facility] is no longer managing her pregnancy.

## 2016-11-03 NOTE — Telephone Encounter (Signed)
[employer] interpreter Rutha Bouchard [phone] used to relay message to patient that she will need to contact her high risk clinic for any prenatal concerns.  We are only able to see her issues not related to her OB care.  Will wait to cancel appt once I have spoke with patient.  LM with interpreter.  Stina Gane,CMA

## 2016-11-12 ENCOUNTER — Encounter: Payer: Self-pay | Admitting: Obstetrics & Gynecology

## 2016-11-12 ENCOUNTER — Ambulatory Visit (INDEPENDENT_AMBULATORY_CARE_PROVIDER_SITE_OTHER): Payer: Medicaid Other | Admitting: Obstetrics & Gynecology

## 2016-11-12 VITALS — BP 112/78 | HR 89 | Wt 179.7 lb

## 2016-11-12 DIAGNOSIS — O09522 Supervision of elderly multigravida, second trimester: Secondary | ICD-10-CM

## 2016-11-12 DIAGNOSIS — O0992 Supervision of high risk pregnancy, unspecified, second trimester: Secondary | ICD-10-CM

## 2016-11-12 DIAGNOSIS — O34219 Maternal care for unspecified type scar from previous cesarean delivery: Secondary | ICD-10-CM

## 2016-11-12 DIAGNOSIS — O0990 Supervision of high risk pregnancy, unspecified, unspecified trimester: Secondary | ICD-10-CM

## 2016-11-12 NOTE — Progress Notes (Signed)
Video Interpreter # [id number]

## 2016-11-12 NOTE — Progress Notes (Signed)
   PRENATAL VISIT NOTE  Subjective:  Cheryl Bauer is a 41 y.o. G6P5004 at [redacted]w[redacted]d being seen today for ongoing prenatal care.  She is currently monitored for the following issues for this high-risk pregnancy and has Language barrier, speaks Arabic; Supervision of high-risk pregnancy; Advanced maternal age in multigravida; Vaginal bleeding in pregnancy, second trimester; History of cesarean delivery x 3, currently pregnant; Neonatal death in prior pregnancy, currently pregnant; and Conductive hearing loss of right ear with unrestricted hearing of left ear on her problem list.  Patient is Arabic-speaking only, Arabic video interpreter present for this encounter.  Patient reports no complaints.  Contractions: Not present. Vag. Bleeding: None.  Movement: Present. Denies leaking of fluid.   The following portions of the patient's history were reviewed and updated as appropriate: allergies, current medications, past family history, past medical history, past social history, past surgical history and problem list. Problem list updated.  Objective:   Vitals:   11/12/16 0934  BP: 112/78  Pulse: 89  Weight: 179 lb 11.2 oz (81.5 kg)    Fetal Status: Fetal Heart Rate (bpm): 165   Movement: Present     General:  Alert, oriented and cooperative. Patient is in no acute distress.  Skin: Skin is warm and dry. No rash noted.   Cardiovascular: Normal heart rate noted  Respiratory: Normal respiratory effort, no problems with respiration noted  Abdomen: Soft, gravid, appropriate for gestational age. Pain/Pressure: Present     Pelvic:  Cervical exam deferred        Extremities: Normal range of motion.  Edema: Trace  Mental Status: Normal mood and affect. Normal behavior. Normal judgment and thought content.   Assessment and Plan:  Pregnancy: G6P5004 at [redacted]w[redacted]d  1. Elderly multigravida in second trimester 2. History of cesarean delivery x 3, currently pregnant 3. Supervision of high risk pregnancy,  antepartum Limited anatomy scan earlier, rescan scheduled Declined all aneuploidy screening Declined BTS, wants natural family planning. Preterm labor symptoms and general obstetric precautions including but not limited to vaginal bleeding, contractions, leaking of fluid and fetal movement were reviewed in detail with the patient. Please refer to After Visit Summary for other counseling recommendations.  Return in about 4 weeks (around 12/10/2016) for OB Visit.   Tereso Newcomer, MD

## 2016-11-12 NOTE — Patient Instructions (Signed)
AREA PEDIATRIC/FAMILY PRACTICE PHYSICIANS  [facility] [address], [address] [place], [place]  [address] Phone - [phone]   Fax - [phone]  [facility] [address] [place], [place] [address] Phone - [phone]   Fax - [phone]  [facility] [address] [place], [place]  [address] Phone - [phone]   Fax - [phone]  [facility] [address] [place], [place]  [address] Phone - [phone]   Fax - [phone]  [facility] [address] [place], [place]  [address] Phone - [phone]   Fax - [phone]  [facility] [address] [place], [place]  [address] Phone - [phone]   Fax - [phone]  [facility] [address] [place], [place]  [address] Phone - [phone]   Fax - [phone]  [facility] [address], [address] [place], [place]  [address] Phone - [phone]   Fax - [phone]  [facility] [address] [place], [place]  [address] Phone - [phone]   Fax - [phone] [facility] [address] [place], [place]  [address] Phone - [phone]   Fax - [phone]  [facility] [address] [place], [place]  [address] Phone - [phone]   Fax - [phone]  [facility] [address] [place], [place]  [address] Phone - [phone]   Fax - [phone]  [facility] [address] [place], [place]  [address] Phone - [phone]   Fax - [phone]  [facility] [address] [place], [place]  [address] Phone - [phone]  [facility] [address] [place], [place]  [address] Phone - [phone]   Fax - [phone]  [facility] [address] [place], [place]  [address] Phone - [phone]   Fax - [phone]  [facility] [address] [place], [place]  [address] Phone - [phone]   Fax - [phone]  [facility] [address] [place], [place]  [address] Phone - [phone]   Fax - [phone]   [facility] [address] [place], [place]  [address] Phone - [phone]   Fax - [phone]  [facility] [address] [place], [place]  [address] Phone - [phone]   Fax - [phone]  [facility] [address] , [place]  [address] Phone - [phone]   Fax - [phone]  [facility] [address] , [place]  [address] Phone - [phone]   Fax - [phone]  [facility] [address] , [place]  [address] Phone - [phone]   Fax - [phone] [facility] [address] [place], [place]  [address] Phone - [phone]   Fax - [phone]  [facility] [address] [place], [place]  [address] Phone - [phone]   Fax - [phone]  [facility] [address] [place], [place]  [address] Phone - [phone]   Fax - [phone]  [facility] Charlene Flemming MD [address] [place] [address] Phone [phone]  Fax [phone]   

## 2016-11-20 ENCOUNTER — Other Ambulatory Visit: Payer: Self-pay | Admitting: Student

## 2016-11-23 ENCOUNTER — Telehealth: Payer: Self-pay | Admitting: *Deleted

## 2016-11-23 NOTE — Telephone Encounter (Signed)
Per patient chart, new prescription for zofran dated for today.

## 2016-11-23 NOTE — Telephone Encounter (Signed)
Patient left message. Tried to pick up Zofran, pharmacy say it needs to be okayed by clinic. Pt states has been vomiting and feeling dizzy. Please call.

## 2016-11-30 ENCOUNTER — Ambulatory Visit (HOSPITAL_COMMUNITY)
Admission: RE | Admit: 2016-11-30 | Discharge: 2016-11-30 | Disposition: A | Discharge status: Home / Self Care | Payer: Medicaid Other | Source: Ambulatory Visit | Attending: Family Medicine | Admitting: Family Medicine

## 2016-11-30 ENCOUNTER — Encounter (HOSPITAL_COMMUNITY): Payer: Self-pay

## 2016-11-30 ENCOUNTER — Other Ambulatory Visit (HOSPITAL_COMMUNITY): Payer: Self-pay | Admitting: *Deleted

## 2016-11-30 DIAGNOSIS — O09529 Supervision of elderly multigravida, unspecified trimester: Secondary | ICD-10-CM

## 2016-11-30 DIAGNOSIS — Z3A23 23 weeks gestation of pregnancy: Secondary | ICD-10-CM | POA: Insufficient documentation

## 2016-11-30 DIAGNOSIS — O09522 Supervision of elderly multigravida, second trimester: Secondary | ICD-10-CM | POA: Diagnosis present

## 2016-12-09 ENCOUNTER — Ambulatory Visit (INDEPENDENT_AMBULATORY_CARE_PROVIDER_SITE_OTHER): Payer: Medicaid Other | Admitting: Family Medicine

## 2016-12-09 ENCOUNTER — Encounter: Payer: Self-pay | Admitting: Family Medicine

## 2016-12-09 VITALS — BP 116/64 | HR 86 | Wt 177.9 lb

## 2016-12-09 DIAGNOSIS — O09299 Supervision of pregnancy with other poor reproductive or obstetric history, unspecified trimester: Secondary | ICD-10-CM

## 2016-12-09 DIAGNOSIS — Z789 Other specified health status: Secondary | ICD-10-CM | POA: Diagnosis not present

## 2016-12-09 DIAGNOSIS — O09522 Supervision of elderly multigravida, second trimester: Secondary | ICD-10-CM | POA: Diagnosis not present

## 2016-12-09 DIAGNOSIS — O34219 Maternal care for unspecified type scar from previous cesarean delivery: Secondary | ICD-10-CM | POA: Diagnosis not present

## 2016-12-09 DIAGNOSIS — O09292 Supervision of pregnancy with other poor reproductive or obstetric history, second trimester: Secondary | ICD-10-CM | POA: Diagnosis not present

## 2016-12-09 DIAGNOSIS — M542 Cervicalgia: Secondary | ICD-10-CM | POA: Diagnosis not present

## 2016-12-09 DIAGNOSIS — O0992 Supervision of high risk pregnancy, unspecified, second trimester: Secondary | ICD-10-CM

## 2016-12-09 MED ORDER — CYCLOBENZAPRINE HCL 10 MG PO TABS: 10.0000 mg | ORAL_TABLET | Freq: Three times a day (TID) | ORAL | 2 refills | Status: DC | PRN

## 2016-12-09 NOTE — Progress Notes (Signed)
   PRENATAL VISIT NOTE  Subjective:  Cheryl Bauer is a 41 y.o. G6P5004 at 1641w6d being seen today for ongoing prenatal care.  She is currently monitored for the following issues for this high-risk pregnancy and has Language barrier, speaks Arabic; Supervision of high-risk pregnancy; Advanced maternal age in multigravida; Vaginal bleeding in pregnancy, second trimester; History of cesarean delivery x 3, currently pregnant; Neonatal death in prior pregnancy, currently pregnant; and Conductive hearing loss of right ear with unrestricted hearing of left ear on her problem list.  Patient reports neck pain.  Contractions: Not present.  .  Movement: Present. Denies leaking of fluid.   The following portions of the patient's history were reviewed and updated as appropriate: allergies, current medications, past family history, past medical history, past social history, past surgical history and problem list. Problem list updated.  Objective:   Vitals:   12/09/16 0916  BP: 116/64  Pulse: 86  Weight: 177 lb 14.4 oz (80.7 kg)    Fetal Status: Fetal Heart Rate (bpm): 154 Fundal Height: 24 cm Movement: Present     General:  Alert, oriented and cooperative. Patient is in no acute distress.  Skin: Skin is warm and dry. No rash noted.   Cardiovascular: Normal heart rate noted  Respiratory: Normal respiratory effort, no problems with respiration noted  Abdomen: Soft, gravid, appropriate for gestational age. Pain/Pressure: Present     Pelvic:  Cervical exam deferred        Extremities: Normal range of motion.  Edema: Trace  Mental Status: Normal mood and affect. Normal behavior. Normal judgment and thought content.   Assessment and Plan:  Pregnancy: G6P5004 at 3741w6d  1. Language barrier, speaks Arabic Video interpreter Saif used  2. Supervision of high risk pregnancy in second trimester Continue routine prenatal care. Has f/u u/s scheduled  3. Neck pain Trial of fexeril - cyclobenzaprine  (FLEXERIL) 10 MG tablet; Take 1 tablet (10 mg total) by mouth 3 (three) times daily as needed for muscle spasms.  Dispense: 30 tablet; Refill: 2  4. Elderly multigravida in second trimester Declined genetics  5. History of cesarean delivery x 3, currently pregnant Will need repeat  6. Neonatal death in prior pregnancy, currently pregnant   Preterm labor symptoms and general obstetric precautions including but not limited to vaginal bleeding, contractions, leaking of fluid and fetal movement were reviewed in detail with the patient. Please refer to After Visit Summary for other counseling recommendations.  Return in 4 weeks (on 01/06/2017) for 28 wk labs, HRC.   Reva BoresPratt, Cheryl Onorato S, MD

## 2016-12-09 NOTE — Patient Instructions (Signed)
 Second Trimester of Pregnancy The second trimester is from week 14 through week 27 (months 4 through 6). The second trimester is often a time when you feel your best. Your body has adjusted to being pregnant, and you begin to feel better physically. Usually, morning sickness has lessened or quit completely, you may have more energy, and you may have an increase in appetite. The second trimester is also a time when the fetus is growing rapidly. At the end of the sixth month, the fetus is about 9 inches long and weighs about 1 pounds. You will likely begin to feel the baby move (quickening) between 16 and 20 weeks of pregnancy. Body changes during your second trimester Your body continues to go through many changes during your second trimester. The changes vary from woman to woman.  Your weight will continue to increase. You will notice your lower abdomen bulging out.  You may begin to get stretch marks on your hips, abdomen, and breasts.  You may develop headaches that can be relieved by medicines. The medicines should be approved by your health care provider.  You may urinate more often because the fetus is pressing on your bladder.  You may develop or continue to have heartburn as a result of your pregnancy.  You may develop constipation because certain hormones are causing the muscles that push waste through your intestines to slow down.  You may develop hemorrhoids or swollen, bulging veins (varicose veins).  You may have back pain. This is caused by: ? Weight gain. ? Pregnancy hormones that are relaxing the joints in your pelvis. ? A shift in weight and the muscles that support your balance.  Your breasts will continue to grow and they will continue to become tender.  Your gums may bleed and may be sensitive to brushing and flossing.  Dark spots or blotches (chloasma, mask of pregnancy) may develop on your face. This will likely fade after the baby is born.  A dark line from  your belly button to the pubic area (linea nigra) may appear. This will likely fade after the baby is born.  You may have changes in your hair. These can include thickening of your hair, rapid growth, and changes in texture. Some women also have hair loss during or after pregnancy, or hair that feels dry or thin. Your hair will most likely return to normal after your baby is born.  What to expect at prenatal visits During a routine prenatal visit:  You will be weighed to make sure you and the fetus are growing normally.  Your blood pressure will be taken.  Your abdomen will be measured to track your baby's growth.  The fetal heartbeat will be listened to.  Any test results from the previous visit will be discussed.  Your health care provider may ask you:  How you are feeling.  If you are feeling the baby move.  If you have had any abnormal symptoms, such as leaking fluid, bleeding, severe headaches, or abdominal cramping.  If you are using any tobacco products, including cigarettes, chewing tobacco, and electronic cigarettes.  If you have any questions.  Other tests that may be performed during your second trimester include:  Blood tests that check for: ? Low iron levels (anemia). ? High blood sugar that affects pregnant women (gestational diabetes) between 24 and 28 weeks. ? Rh antibodies. This is to check for a protein on red blood cells (Rh factor).  Urine tests to check for infections, diabetes,   or protein in the urine.  An ultrasound to confirm the proper growth and development of the baby.  An amniocentesis to check for possible genetic problems.  Fetal screens for spina bifida and Down syndrome.  HIV (human immunodeficiency virus) testing. Routine prenatal testing includes screening for HIV, unless you choose not to have this test.  Follow these instructions at home: Medicines  Follow your health care provider's instructions regarding medicine use. Specific  medicines may be either safe or unsafe to take during pregnancy.  Take a prenatal vitamin that contains at least 600 micrograms (mcg) of folic acid.  If you develop constipation, try taking a stool softener if your health care provider approves. Eating and drinking  Eat a balanced diet that includes fresh fruits and vegetables, whole grains, good sources of protein such as meat, eggs, or tofu, and low-fat dairy. Your health care provider will help you determine the amount of weight gain that is right for you.  Avoid raw meat and uncooked cheese. These carry germs that can cause birth defects in the baby.  If you have low calcium intake from food, talk to your health care provider about whether you should take a daily calcium supplement.  Limit foods that are high in fat and processed sugars, such as fried and sweet foods.  To prevent constipation: ? Drink enough fluid to keep your urine clear or pale yellow. ? Eat foods that are high in fiber, such as fresh fruits and vegetables, whole grains, and beans. Activity  Exercise only as directed by your health care provider. Most women can continue their usual exercise routine during pregnancy. Try to exercise for 30 minutes at least 5 days a week. Stop exercising if you experience uterine contractions.  Avoid heavy lifting, wear low heel shoes, and practice good posture.  A sexual relationship may be continued unless your health care provider directs you otherwise. Relieving pain and discomfort  Wear a good support bra to prevent discomfort from breast tenderness.  Take warm sitz baths to soothe any pain or discomfort caused by hemorrhoids. Use hemorrhoid cream if your health care provider approves.  Rest with your legs elevated if you have leg cramps or low back pain.  If you develop varicose veins, wear support hose. Elevate your feet for 15 minutes, 3-4 times a day. Limit salt in your diet. Prenatal Care  Write down your questions.  Take them to your prenatal visits.  Keep all your prenatal visits as told by your health care provider. This is important. Safety  Wear your seat belt at all times when driving.  Make a list of emergency phone numbers, including numbers for family, friends, the hospital, and police and fire departments. General instructions  Ask your health care provider for a referral to a local prenatal education class. Begin classes no later than the beginning of month 6 of your pregnancy.  Ask for help if you have counseling or nutritional needs during pregnancy. Your health care provider can offer advice or refer you to specialists for help with various needs.  Do not use hot tubs, steam rooms, or saunas.  Do not douche or use tampons or scented sanitary pads.  Do not cross your legs for long periods of time.  Avoid cat litter boxes and soil used by cats. These carry germs that can cause birth defects in the baby and possibly loss of the fetus by miscarriage or stillbirth.  Avoid all smoking, herbs, alcohol, and unprescribed drugs. Chemicals in these products   can affect the formation and growth of the baby.  Do not use any products that contain nicotine or tobacco, such as cigarettes and e-cigarettes. If you need help quitting, ask your health care provider.  Visit your dentist if you have not gone yet during your pregnancy. Use a soft toothbrush to brush your teeth and be gentle when you floss. Contact a health care provider if:  You have dizziness.  You have mild pelvic cramps, pelvic pressure, or nagging pain in the abdominal area.  You have persistent nausea, vomiting, or diarrhea.  You have a bad smelling vaginal discharge.  You have pain when you urinate. Get help right away if:  You have a fever.  You are leaking fluid from your vagina.  You have spotting or bleeding from your vagina.  You have severe abdominal cramping or pain.  You have rapid weight gain or weight  loss.  You have shortness of breath with chest pain.  You notice sudden or extreme swelling of your face, hands, ankles, feet, or legs.  You have not felt your baby move in over an hour.  You have severe headaches that do not go away when you take medicine.  You have vision changes. Summary  The second trimester is from week 14 through week 27 (months 4 through 6). It is also a time when the fetus is growing rapidly.  Your body goes through many changes during pregnancy. The changes vary from woman to woman.  Avoid all smoking, herbs, alcohol, and unprescribed drugs. These chemicals affect the formation and growth your baby.  Do not use any tobacco products, such as cigarettes, chewing tobacco, and e-cigarettes. If you need help quitting, ask your health care provider.  Contact your health care provider if you have any questions. Keep all prenatal visits as told by your health care provider. This is important. This information is not intended to replace advice given to you by your health care provider. Make sure you discuss any questions you have with your health care provider. Document Released: 07/07/2001 Document Revised: 12/19/2015 Document Reviewed: 09/13/2012 [employer] Interactive Patient Education  2017 [employer]   Breastfeeding Deciding to breastfeed is one of the best choices you can make for you and your baby. A change in hormones during pregnancy causes your breast tissue to grow and increases the number and size of your milk ducts. These hormones also allow proteins, sugars, and fats from your blood supply to make breast milk in your milk-producing glands. Hormones prevent breast milk from being released before your baby is born as well as prompt milk flow after birth. Once breastfeeding has begun, thoughts of your baby, as well as his or her sucking or crying, can stimulate the release of milk from your milk-producing glands. Benefits of breastfeeding For Your  Baby  Your first milk (colostrum) helps your baby's digestive system function better.  There are antibodies in your milk that help your baby fight off infections.  Your baby has a lower incidence of asthma, allergies, and sudden infant death syndrome.  The nutrients in breast milk are better for your baby than infant formulas and are designed uniquely for your baby's needs.  Breast milk improves your baby's brain development.  Your baby is less likely to develop other conditions, such as childhood obesity, asthma, or type 2 diabetes mellitus.  For You  Breastfeeding helps to create a very special bond between you and your baby.  Breastfeeding is convenient. Breast milk is always available at   the correct temperature and costs nothing.  Breastfeeding helps to burn calories and helps you lose the weight gained during pregnancy.  Breastfeeding makes your uterus contract to its prepregnancy size faster and slows bleeding (lochia) after you give birth.  Breastfeeding helps to lower your risk of developing type 2 diabetes mellitus, osteoporosis, and breast or ovarian cancer later in life.  Signs that your baby is hungry Early Signs of Hunger  Increased alertness or activity.  Stretching.  Movement of the head from side to side.  Movement of the head and opening of the mouth when the corner of the mouth or cheek is stroked (rooting).  Increased sucking sounds, smacking lips, cooing, sighing, or squeaking.  Hand-to-mouth movements.  Increased sucking of fingers or hands.  Late Signs of Hunger  Fussing.  Intermittent crying.  Extreme Signs of Hunger Signs of extreme hunger will require calming and consoling before your baby will be able to breastfeed successfully. Do not wait for the following signs of extreme hunger to occur before you initiate breastfeeding:  Restlessness.  A loud, strong cry.  Screaming.  Breastfeeding basics Breastfeeding Initiation  Find a  comfortable place to sit or lie down, with your neck and back well supported.  Place a pillow or rolled up blanket under your baby to bring him or her to the level of your breast (if you are seated). Nursing pillows are specially designed to help support your arms and your baby while you breastfeed.  Make sure that your baby's abdomen is facing your abdomen.  Gently massage your breast. With your fingertips, massage from your chest wall toward your nipple in a circular motion. This encourages milk flow. You may need to continue this action during the feeding if your milk flows slowly.  Support your breast with 4 fingers underneath and your thumb above your nipple. Make sure your fingers are well away from your nipple and your baby's mouth.  Stroke your baby's lips gently with your finger or nipple.  When your baby's mouth is open wide enough, quickly bring your baby to your breast, placing your entire nipple and as much of the colored area around your nipple (areola) as possible into your baby's mouth. ? More areola should be visible above your baby's upper lip than below the lower lip. ? Your baby's tongue should be between his or her lower gum and your breast.  Ensure that your baby's mouth is correctly positioned around your nipple (latched). Your baby's lips should create a seal on your breast and be turned out (everted).  It is common for your baby to suck about 2-3 minutes in order to start the flow of breast milk.  Latching Teaching your baby how to latch on to your breast properly is very important. An improper latch can cause nipple pain and decreased milk supply for you and poor weight gain in your baby. Also, if your baby is not latched onto your nipple properly, he or she may swallow some air during feeding. This can make your baby fussy. Burping your baby when you switch breasts during the feeding can help to get rid of the air. However, teaching your baby to latch on properly is  still the best way to prevent fussiness from swallowing air while breastfeeding. Signs that your baby has successfully latched on to your nipple:  Silent tugging or silent sucking, without causing you pain.  Swallowing heard between every 3-4 sucks.  Muscle movement above and in front of his or her   ears while sucking.  Signs that your baby has not successfully latched on to nipple:  Sucking sounds or smacking sounds from your baby while breastfeeding.  Nipple pain.  If you think your baby has not latched on correctly, slip your finger into the corner of your baby's mouth to break the suction and place it between your baby's gums. Attempt breastfeeding initiation again. Signs of Successful Breastfeeding Signs from your baby:  A gradual decrease in the number of sucks or complete cessation of sucking.  Falling asleep.  Relaxation of his or her body.  Retention of a small amount of milk in his or her mouth.  Letting go of your breast by himself or herself.  Signs from you:  Breasts that have increased in firmness, weight, and size 1-3 hours after feeding.  Breasts that are softer immediately after breastfeeding.  Increased milk volume, as well as a change in milk consistency and color by the fifth day of breastfeeding.  Nipples that are not sore, cracked, or bleeding.  Signs That Your Baby is Getting Enough Milk  Wetting at least 1-2 diapers during the first 24 hours after birth.  Wetting at least 5-6 diapers every 24 hours for the first week after birth. The urine should be clear or pale yellow by 5 days after birth.  Wetting 6-8 diapers every 24 hours as your baby continues to grow and develop.  At least 3 stools in a 24-hour period by age 5 days. The stool should be soft and yellow.  At least 3 stools in a 24-hour period by age 7 days. The stool should be seedy and yellow.  No loss of weight greater than 10% of birth weight during the first 3 days of age.  Average  weight gain of 4-7 ounces (113-198 g) per week after age 4 days.  Consistent daily weight gain by age 5 days, without weight loss after the age of 2 weeks.  After a feeding, your baby may spit up a small amount. This is common. Breastfeeding frequency and duration Frequent feeding will help you make more milk and can prevent sore nipples and breast engorgement. Breastfeed when you feel the need to reduce the fullness of your breasts or when your baby shows signs of hunger. This is called "breastfeeding on demand." Avoid introducing a pacifier to your baby while you are working to establish breastfeeding (the first 4-6 weeks after your baby is born). After this time you may choose to use a pacifier. Research has shown that pacifier use during the first year of a baby's life decreases the risk of sudden infant death syndrome (SIDS). Allow your baby to feed on each breast as long as he or she wants. Breastfeed until your baby is finished feeding. When your baby unlatches or falls asleep while feeding from the first breast, offer the second breast. Because newborns are often sleepy in the first few weeks of life, you may need to awaken your baby to get him or her to feed. Breastfeeding times will vary from baby to baby. However, the following rules can serve as a guide to help you ensure that your baby is properly fed:  Newborns (babies 4 weeks of age or younger) may breastfeed every 1-3 hours.  Newborns should not go longer than 3 hours during the day or 5 hours during the night without breastfeeding.  You should breastfeed your baby a minimum of 8 times in a 24-hour period until you begin to introduce solid foods to your   baby at around 6 months of age.  Breast milk pumping Pumping and storing breast milk allows you to ensure that your baby is exclusively fed your breast milk, even at times when you are unable to breastfeed. This is especially important if you are going back to work while you are still  breastfeeding or when you are not able to be present during feedings. Your lactation consultant can give you guidelines on how long it is safe to store breast milk. A breast pump is a machine that allows you to pump milk from your breast into a sterile bottle. The pumped breast milk can then be stored in a refrigerator or freezer. Some breast pumps are operated by hand, while others use electricity. Ask your lactation consultant which type will work best for you. Breast pumps can be purchased, but some hospitals and breastfeeding support groups lease breast pumps on a monthly basis. A lactation consultant can teach you how to hand express breast milk, if you prefer not to use a pump. Caring for your breasts while you breastfeed Nipples can become dry, cracked, and sore while breastfeeding. The following recommendations can help keep your breasts moisturized and healthy:  Avoid using soap on your nipples.  Wear a supportive bra. Although not required, special nursing bras and tank tops are designed to allow access to your breasts for breastfeeding without taking off your entire bra or top. Avoid wearing underwire-style bras or extremely tight bras.  Air dry your nipples for 3-4minutes after each feeding.  Use only cotton bra pads to absorb leaked breast milk. Leaking of breast milk between feedings is normal.  Use lanolin on your nipples after breastfeeding. Lanolin helps to maintain your skin's normal moisture barrier. If you use pure lanolin, you do not need to wash it off before feeding your baby again. Pure lanolin is not toxic to your baby. You may also hand express a few drops of breast milk and gently massage that milk into your nipples and allow the milk to air dry.  In the first few weeks after giving birth, some women experience extremely full breasts (engorgement). Engorgement can make your breasts feel heavy, warm, and tender to the touch. Engorgement peaks within 3-5 days after you give  birth. The following recommendations can help ease engorgement:  Completely empty your breasts while breastfeeding or pumping. You may want to start by applying warm, moist heat (in the shower or with warm water-soaked hand towels) just before feeding or pumping. This increases circulation and helps the milk flow. If your baby does not completely empty your breasts while breastfeeding, pump any extra milk after he or she is finished.  Wear a snug bra (nursing or regular) or tank top for 1-2 days to signal your body to slightly decrease milk production.  Apply ice packs to your breasts, unless this is too uncomfortable for you.  Make sure that your baby is latched on and positioned properly while breastfeeding.  If engorgement persists after 48 hours of following these recommendations, contact your health care provider or a lactation consultant. Overall health care recommendations while breastfeeding  Eat healthy foods. Alternate between meals and snacks, eating 3 of each per day. Because what you eat affects your breast milk, some of the foods may make your baby more irritable than usual. Avoid eating these foods if you are sure that they are negatively affecting your baby.  Drink milk, fruit juice, and water to satisfy your thirst (about 10 glasses a day).    Rest often, relax, and continue to take your prenatal vitamins to prevent fatigue, stress, and anemia.  Continue breast self-awareness checks.  Avoid chewing and smoking tobacco. Chemicals from cigarettes that pass into breast milk and exposure to secondhand smoke may harm your baby.  Avoid alcohol and drug use, including marijuana. Some medicines that may be harmful to your baby can pass through breast milk. It is important to ask your health care provider before taking any medicine, including all over-the-counter and prescription medicine as well as vitamin and herbal supplements. It is possible to become pregnant while breastfeeding.  If birth control is desired, ask your health care provider about options that will be safe for your baby. Contact a health care provider if:  You feel like you want to stop breastfeeding or have become frustrated with breastfeeding.  You have painful breasts or nipples.  Your nipples are cracked or bleeding.  Your breasts are red, tender, or warm.  You have a swollen area on either breast.  You have a fever or chills.  You have nausea or vomiting.  You have drainage other than breast milk from your nipples.  Your breasts do not become full before feedings by the fifth day after you give birth.  You feel sad and depressed.  Your baby is too sleepy to eat well.  Your baby is having trouble sleeping.  Your baby is wetting less than 3 diapers in a 24-hour period.  Your baby has less than 3 stools in a 24-hour period.  Your baby's skin or the white part of his or her eyes becomes yellow.  Your baby is not gaining weight by 5 days of age. Get help right away if:  Your baby is overly tired (lethargic) and does not want to wake up and feed.  Your baby develops an unexplained fever. This information is not intended to replace advice given to you by your health care provider. Make sure you discuss any questions you have with your health care provider. Document Released: 07/13/2005 Document Revised: 12/25/2015 Document Reviewed: 01/04/2013 [employer] Interactive Patient Education  2017 [employer]  

## 2016-12-22 ENCOUNTER — Ambulatory Visit (HOSPITAL_COMMUNITY)
Admission: EM | Admit: 2016-12-22 | Discharge: 2016-12-22 | Disposition: A | Discharge status: Home / Self Care | Payer: Medicaid Other | Attending: Family Medicine | Admitting: Family Medicine

## 2016-12-22 ENCOUNTER — Encounter (HOSPITAL_COMMUNITY): Payer: Self-pay | Admitting: Emergency Medicine

## 2016-12-22 DIAGNOSIS — R590 Localized enlarged lymph nodes: Secondary | ICD-10-CM | POA: Diagnosis not present

## 2016-12-22 MED ORDER — PENICILLIN V POTASSIUM 500 MG PO TABS: 500.0000 mg | ORAL_TABLET | Freq: Three times a day (TID) | ORAL | 0 refills | Status: DC

## 2016-12-22 NOTE — ED Provider Notes (Signed)
[facility]    CSN: [id number] Arrival date & time: 12/22/16  1134     History   Chief Complaint Chief Complaint  Patient presents with  . Back Pain    HPI Cheryl Bauer is a 41 y.o. female.   Pain under left ear and down neck.  Onset 4 days ago.  Initially pain was along left shoulder, but now concentrated below left ear and neck.  Feels something in throat.    Patient has seen a doctor for upper back, shoulder pain recently and placed on flexeril-patient is concerned how safe this is for the baby.  Patient is pregnant.  This 26 year old woman is a refugee from [place]. She is quite anxious because her father died of cancer and she thinks that maybe her problem.  She complains that her tooth #17 is sore and she also has left-sided submandibular tenderness and left-sided neck pain.      Past Medical History:  Diagnosis Date  . 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 Headache    not specified by pt. - occasional  . Hearing loss in right ear 11/2015  . 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 Neonatal death in prior pregnancy, currently pregnant 10/15/2016  . History of cesarean delivery x 3, currently pregnant 10/01/2016  . Advanced maternal age in multigravida 09/29/2016  . Vaginal bleeding in pregnancy, second trimester 09/29/2016  . Supervision of high-risk pregnancy 08/26/2016  . Conductive hearing loss of right ear with unrestricted hearing of left ear 11/04/2015  . Language barrier, speaks Arabic 05/31/2015    Past Surgical History:  Procedure Laterality Date  . CESAREAN SECTION    . STAPEDECTOMY Right 12/04/2015   Procedure: RIGHT SIDE TYMPANOTOMY EXPLORATORY WITH STAPEDECTOMY;  Surgeon: Serena Colonel, MD;  Location:  [facility];  Service: ENT;  Laterality: Right;    OB History    Gravida Para Term Preterm AB Living   6 5 5     4    SAB TAB Ectopic Multiple Live Births      5       Home Medications    Prior to Admission medications   Medication Sig Start Date End Date Taking? Authorizing Provider  acetaminophen (TYLENOL) 325 MG tablet Take 650 mg by mouth every 6 (six) hours as needed.    [provider]  ondansetron (ZOFRAN) 4 MG tablet TK 1 T PO  Q 8 H PRF NAUSEA OR VOMITING 10/09/16   [provider]  penicillin v potassium (VEETID) 500 MG tablet Take 1 tablet (500 mg total) by mouth 3 (three) times daily. 12/22/16   Elvina Sidle, MD  Prenatal Vit-Iron Carbonyl-FA (PNV TABS 29-1) 29-1 MG TABS TK 1 T PO QD 09/24/16   [provider]  PRESCRIPTION MEDICATION     [provider]    Family History Family History  Problem Relation Age of Onset  . Cancer Father        kidney    Social History Social History  Substance Use Topics  . Smoking status: Former Smoker    Types: E-cigarettes  . Smokeless tobacco: Never Used     Comment: Hookah  . Alcohol use No     Allergies   Patient has no known allergies.   Review of Systems Review of Systems  Constitutional: Negative.   HENT: Positive for dental problem.   Musculoskeletal: Positive for neck pain.  All  other systems reviewed and are negative.    Physical Exam Triage Vital Signs ED Triage Vitals  Enc Vitals Group     BP 12/22/16 1236 93/60     Pulse Rate 12/22/16 1236 92     Resp 12/22/16 1236 20     Temp 12/22/16 1236 98.2 F (36.8 C)     Temp Source 12/22/16 1236 Oral     SpO2 12/22/16 1236 98 %     Weight --      Height --      Head Circumference --      Peak Flow --      Pain Score 12/22/16 1231 6     Pain Loc --      Pain Edu? --      Excl. in GC? --    No data found.   Updated Vital Signs BP 93/60 (BP Location: Left Arm) Comment: patient is fasting  Pulse 92   Temp 98.2 F (36.8 C) (Oral)   Resp 20   LMP 05/20/2016 (Exact Date)   SpO2 98%    Physical Exam  Constitutional: She is oriented to person, place, and time. She  appears well-developed and well-nourished.  HENT:  Right Ear: External ear normal.  Left Ear: External ear normal.  Mouth/Throat: Oropharynx is clear and moist.  Eyes: Conjunctivae and EOM are normal. Pupils are equal, round, and reactive to light.  Neck: Normal range of motion. Neck supple.  Pulmonary/Chest: Effort normal.  Musculoskeletal: Normal range of motion.  Lymphadenopathy:    She has cervical adenopathy.  Neurological: She is alert and oriented to person, place, and time.  Skin: Skin is warm and dry.  Nursing note and vitals reviewed.    UC Treatments / Results  Labs (all labs ordered are listed, but only abnormal results are displayed) Labs Reviewed - No data to display  EKG  EKG Interpretation None       Radiology No results found.  Procedures Procedures (including critical care time)  Medications Ordered in UC Medications - No data to display   Initial Impression / Assessment and Plan / UC Course  I have reviewed the triage vital signs and the nursing notes.  Pertinent labs & imaging results that were available during my care of the patient were reviewed by me and considered in my medical decision making (see chart for details).     Final Clinical Impressions(s) / UC Diagnoses   Final diagnoses:  Cervical adenopathy    New Prescriptions New Prescriptions   PENICILLIN V POTASSIUM (VEETID) 500 MG TABLET    Take 1 tablet (500 mg total) by mouth 3 (three) times daily.     Elvina SidleLauenstein, Yoon Barca, MD 12/22/16 1304

## 2016-12-22 NOTE — ED Triage Notes (Addendum)
Pain under left ear and down neck.  Onset 4 days ago.  Initially pain was along left shoulder, but now concentrated below left ear and neck.  Feels something in throat.    Patient has seen a doctor for upper back, shoulder pain recently and placed on flexeril-patient is concerned how safe this is for the baby  Patient is pregnant

## 2016-12-22 NOTE — Discharge Instructions (Signed)
Stop the cyclobenzaprine

## 2017-01-18 ENCOUNTER — Encounter (HOSPITAL_COMMUNITY): Payer: Self-pay

## 2017-01-18 ENCOUNTER — Ambulatory Visit (HOSPITAL_COMMUNITY)
Admission: RE | Admit: 2017-01-18 | Discharge: 2017-01-18 | Disposition: A | Discharge status: Home / Self Care | Payer: Medicaid Other | Source: Ambulatory Visit | Attending: Family Medicine | Admitting: Family Medicine

## 2017-01-18 ENCOUNTER — Ambulatory Visit (INDEPENDENT_AMBULATORY_CARE_PROVIDER_SITE_OTHER): Payer: Medicaid Other | Admitting: Family Medicine

## 2017-01-18 VITALS — BP 125/79 | HR 97 | Wt 185.6 lb

## 2017-01-18 DIAGNOSIS — O09523 Supervision of elderly multigravida, third trimester: Secondary | ICD-10-CM | POA: Insufficient documentation

## 2017-01-18 DIAGNOSIS — O09529 Supervision of elderly multigravida, unspecified trimester: Secondary | ICD-10-CM

## 2017-01-18 DIAGNOSIS — Z3A30 30 weeks gestation of pregnancy: Secondary | ICD-10-CM | POA: Diagnosis not present

## 2017-01-18 DIAGNOSIS — Z789 Other specified health status: Secondary | ICD-10-CM | POA: Diagnosis not present

## 2017-01-18 DIAGNOSIS — O0993 Supervision of high risk pregnancy, unspecified, third trimester: Secondary | ICD-10-CM

## 2017-01-18 DIAGNOSIS — Z23 Encounter for immunization: Secondary | ICD-10-CM

## 2017-01-18 DIAGNOSIS — O0992 Supervision of high risk pregnancy, unspecified, second trimester: Secondary | ICD-10-CM

## 2017-01-18 DIAGNOSIS — O09293 Supervision of pregnancy with other poor reproductive or obstetric history, third trimester: Secondary | ICD-10-CM | POA: Insufficient documentation

## 2017-01-18 DIAGNOSIS — O34219 Maternal care for unspecified type scar from previous cesarean delivery: Secondary | ICD-10-CM

## 2017-01-18 NOTE — Progress Notes (Signed)
   PRENATAL VISIT NOTE  Subjective:  Cheryl Bauer is a 41 y.o. [id number] at 3942w4d being seen today for ongoing prenatal care.  She is currently monitored for the following issues for this high-risk pregnancy and has Language barrier, speaks Arabic; Supervision of high-risk pregnancy; Advanced maternal age in multigravida; Vaginal bleeding in pregnancy, second trimester; History of cesarean delivery x 3, currently pregnant; Neonatal death in prior pregnancy, currently pregnant; and Conductive hearing loss of right ear with unrestricted hearing of left ear on her problem list.  Patient reports no complaints.  Contractions: Not present. Vag. Bleeding: None.  Movement: Present. Denies leaking of fluid.   The following portions of the patient's history were reviewed and updated as appropriate: allergies, current medications, past family history, past medical history, past social history, past surgical history and problem list. Problem list updated.  Objective:   Vitals:   01/18/17 1131  BP: 125/79  Pulse: 97  Weight: 185 lb 9.6 oz (84.2 kg)    Fetal Status: Fetal Heart Rate (bpm): 148 Fundal Height: 28 cm Movement: Present     General:  Alert, oriented and cooperative. Patient is in no acute distress.  Skin: Skin is warm and dry. No rash noted.   Cardiovascular: Normal heart rate noted  Respiratory: Normal respiratory effort, no problems with respiration noted  Abdomen: Soft, gravid, appropriate for gestational age. Pain/Pressure: Present     Pelvic:  Cervical exam deferred        Extremities: Normal range of motion.  Edema: Trace  Mental Status: Normal mood and affect. Normal behavior. Normal judgment and thought content.   Assessment and Plan:  Pregnancy: G6P5004 at 4842w4d  1. Supervision of high risk pregnancy in second trimester 28 wk labs and TDaP today - CBC - RPR - HIV antibody - Glucose Tolerance, 2 Hours w/1 Hour - Tdap vaccine greater than or equal to 7yo IM  2. Language  barrier, speaks Arabic Video Arabic interpreter: Saif used   3. History of cesarean delivery x 3, currently pregnant Booked for 4th repeat-- at 39 wks declines BTL  Preterm labor symptoms and general obstetric precautions including but not limited to vaginal bleeding, contractions, leaking of fluid and fetal movement were reviewed in detail with the patient. Please refer to After Visit Summary for other counseling recommendations.  Return in 2 weeks (on 02/01/2017).   Cheryl Boresanya S Taseen Marasigan, MD

## 2017-01-18 NOTE — Patient Instructions (Signed)
 Third Trimester of Pregnancy The third trimester is from week 28 through week 40 (months 7 through 9). The third trimester is a time when the unborn baby (fetus) is growing rapidly. At the end of the ninth month, the fetus is about 20 inches in length and weighs 6-10 pounds. Body changes during your third trimester Your body will continue to go through many changes during pregnancy. The changes vary from woman to woman. During the third trimester:  Your weight will continue to increase. You can expect to gain 25-35 pounds (11-16 kg) by the end of the pregnancy.  You may begin to get stretch marks on your hips, abdomen, and breasts.  You may urinate more often because the fetus is moving lower into your pelvis and pressing on your bladder.  You may develop or continue to have heartburn. This is caused by increased hormones that slow down muscles in the digestive tract.  You may develop or continue to have constipation because increased hormones slow digestion and cause the muscles that push waste through your intestines to relax.  You may develop hemorrhoids. These are swollen veins (varicose veins) in the rectum that can itch or be painful.  You may develop swollen, bulging veins (varicose veins) in your legs.  You may have increased body aches in the pelvis, back, or thighs. This is due to weight gain and increased hormones that are relaxing your joints.  You may have changes in your hair. These can include thickening of your hair, rapid growth, and changes in texture. Some women also have hair loss during or after pregnancy, or hair that feels dry or thin. Your hair will most likely return to normal after your baby is born.  Your breasts will continue to grow and they will continue to become tender. A yellow fluid (colostrum) may leak from your breasts. This is the first milk you are producing for your baby.  Your belly button may stick out.  You may notice more swelling in your  hands, face, or ankles.  You may have increased tingling or numbness in your hands, arms, and legs. The skin on your belly may also feel numb.  You may feel short of breath because of your expanding uterus.  You may have more problems sleeping. This can be caused by the size of your belly, increased need to urinate, and an increase in your body's metabolism.  You may notice the fetus "dropping," or moving lower in your abdomen (lightening).  You may have increased vaginal discharge.  You may notice your joints feel loose and you may have pain around your pelvic bone.  What to expect at prenatal visits You will have prenatal exams every 2 weeks until week 36. Then you will have weekly prenatal exams. During a routine prenatal visit:  You will be weighed to make sure you and the baby are growing normally.  Your blood pressure will be taken.  Your abdomen will be measured to track your baby's growth.  The fetal heartbeat will be listened to.  Any test results from the previous visit will be discussed.  You may have a cervical check near your due date to see if your cervix has softened or thinned (effaced).  You will be tested for Group B streptococcus. This happens between 35 and 37 weeks.  Your health care provider may ask you:  What your birth plan is.  How you are feeling.  If you are feeling the baby move.  If you have   had any abnormal symptoms, such as leaking fluid, bleeding, severe headaches, or abdominal cramping.  If you are using any tobacco products, including cigarettes, chewing tobacco, and electronic cigarettes.  If you have any questions.  Other tests or screenings that may be performed during your third trimester include:  Blood tests that check for low iron levels (anemia).  Fetal testing to check the health, activity level, and growth of the fetus. Testing is done if you have certain medical conditions or if there are problems during the  pregnancy.  Nonstress test (NST). This test checks the health of your baby to make sure there are no signs of problems, such as the baby not getting enough oxygen. During this test, a belt is placed around your belly. The baby is made to move, and its heart rate is monitored during movement.  What is false labor? False labor is a condition in which you feel small, irregular tightenings of the muscles in the womb (contractions) that usually go away with rest, changing position, or drinking water. These are called Braxton Hicks contractions. Contractions may last for hours, days, or even weeks before true labor sets in. If contractions come at regular intervals, become more frequent, increase in intensity, or become painful, you should see your health care provider. What are the signs of labor?  Abdominal cramps.  Regular contractions that start at 10 minutes apart and become stronger and more frequent with time.  Contractions that start on the top of the uterus and spread down to the lower abdomen and back.  Increased pelvic pressure and dull back pain.  A watery or bloody mucus discharge that comes from the vagina.  Leaking of amniotic fluid. This is also known as your "water breaking." It could be a slow trickle or a gush. Let your health care provider know if it has a color or strange odor. If you have any of these signs, call your health care provider right away, even if it is before your due date. Follow these instructions at home: Medicines  Follow your health care provider's instructions regarding medicine use. Specific medicines may be either safe or unsafe to take during pregnancy.  Take a prenatal vitamin that contains at least 600 micrograms (mcg) of folic acid.  If you develop constipation, try taking a stool softener if your health care provider approves. Eating and drinking  Eat a balanced diet that includes fresh fruits and vegetables, whole grains, good sources of protein  such as meat, eggs, or tofu, and low-fat dairy. Your health care provider will help you determine the amount of weight gain that is right for you.  Avoid raw meat and uncooked cheese. These carry germs that can cause birth defects in the baby.  If you have low calcium intake from food, talk to your health care provider about whether you should take a daily calcium supplement.  Eat four or five small meals rather than three large meals a day.  Limit foods that are high in fat and processed sugars, such as fried and sweet foods.  To prevent constipation: ? Drink enough fluid to keep your urine clear or pale yellow. ? Eat foods that are high in fiber, such as fresh fruits and vegetables, whole grains, and beans. Activity  Exercise only as directed by your health care provider. Most women can continue their usual exercise routine during pregnancy. Try to exercise for 30 minutes at least 5 days a week. Stop exercising if you experience uterine contractions.  Avoid   heavy lifting.  Do not exercise in extreme heat or humidity, or at high altitudes.  Wear low-heel, comfortable shoes.  Practice good posture.  You may continue to have sex unless your health care provider tells you otherwise. Relieving pain and discomfort  Take frequent breaks and rest with your legs elevated if you have leg cramps or low back pain.  Take warm sitz baths to soothe any pain or discomfort caused by hemorrhoids. Use hemorrhoid cream if your health care provider approves.  Wear a good support bra to prevent discomfort from breast tenderness.  If you develop varicose veins: ? Wear support pantyhose or compression stockings as told by your healthcare provider. ? Elevate your feet for 15 minutes, 3-4 times a day. Prenatal care  Write down your questions. Take them to your prenatal visits.  Keep all your prenatal visits as told by your health care provider. This is important. Safety  Wear your seat belt at  all times when driving.  Make a list of emergency phone numbers, including numbers for family, friends, the hospital, and police and fire departments. General instructions  Avoid cat litter boxes and soil used by cats. These carry germs that can cause birth defects in the baby. If you have a cat, ask someone to clean the litter box for you.  Do not travel far distances unless it is absolutely necessary and only with the approval of your health care provider.  Do not use hot tubs, steam rooms, or saunas.  Do not drink alcohol.  Do not use any products that contain nicotine or tobacco, such as cigarettes and e-cigarettes. If you need help quitting, ask your health care provider.  Do not use any medicinal herbs or unprescribed drugs. These chemicals affect the formation and growth of the baby.  Do not douche or use tampons or scented sanitary pads.  Do not cross your legs for long periods of time.  To prepare for the arrival of your baby: ? Take prenatal classes to understand, practice, and ask questions about labor and delivery. ? Make a trial run to the hospital. ? Visit the hospital and tour the maternity area. ? Arrange for maternity or paternity leave through employers. ? Arrange for family and friends to take care of pets while you are in the hospital. ? Purchase a rear-facing car seat and make sure you know how to install it in your car. ? Pack your hospital bag. ? Prepare the baby's nursery. Make sure to remove all pillows and stuffed animals from the baby's crib to prevent suffocation.  Visit your dentist if you have not gone during your pregnancy. Use a soft toothbrush to brush your teeth and be gentle when you floss. Contact a health care provider if:  You are unsure if you are in labor or if your water has broken.  You become dizzy.  You have mild pelvic cramps, pelvic pressure, or nagging pain in your abdominal area.  You have lower back pain.  You have persistent  nausea, vomiting, or diarrhea.  You have an unusual or bad smelling vaginal discharge.  You have pain when you urinate. Get help right away if:  Your water breaks before 37 weeks.  You have regular contractions less than 5 minutes apart before 37 weeks.  You have a fever.  You are leaking fluid from your vagina.  You have spotting or bleeding from your vagina.  You have severe abdominal pain or cramping.  You have rapid weight loss or weight   gain.  You have shortness of breath with chest pain.  You notice sudden or extreme swelling of your face, hands, ankles, feet, or legs.  Your baby makes fewer than 10 movements in 2 hours.  You have severe headaches that do not go away when you take medicine.  You have vision changes. Summary  The third trimester is from week 28 through week 40, months 7 through 9. The third trimester is a time when the unborn baby (fetus) is growing rapidly.  During the third trimester, your discomfort may increase as you and your baby continue to gain weight. You may have abdominal, leg, and back pain, sleeping problems, and an increased need to urinate.  During the third trimester your breasts will keep growing and they will continue to become tender. A yellow fluid (colostrum) may leak from your breasts. This is the first milk you are producing for your baby.  False labor is a condition in which you feel small, irregular tightenings of the muscles in the womb (contractions) that eventually go away. These are called Braxton Hicks contractions. Contractions may last for hours, days, or even weeks before true labor sets in.  Signs of labor can include: abdominal cramps; regular contractions that start at 10 minutes apart and become stronger and more frequent with time; watery or bloody mucus discharge that comes from the vagina; increased pelvic pressure and dull back pain; and leaking of amniotic fluid. This information is not intended to replace advice  given to you by your health care provider. Make sure you discuss any questions you have with your health care provider. Document Released: 07/07/2001 Document Revised: 12/19/2015 Document Reviewed: 09/13/2012 [employer] Interactive Patient Education  2017 [employer]   Breastfeeding Deciding to breastfeed is one of the best choices you can make for you and your baby. A change in hormones during pregnancy causes your breast tissue to grow and increases the number and size of your milk ducts. These hormones also allow proteins, sugars, and fats from your blood supply to make breast milk in your milk-producing glands. Hormones prevent breast milk from being released before your baby is born as well as prompt milk flow after birth. Once breastfeeding has begun, thoughts of your baby, as well as his or her sucking or crying, can stimulate the release of milk from your milk-producing glands. Benefits of breastfeeding For Your Baby  Your first milk (colostrum) helps your baby's digestive system function better.  There are antibodies in your milk that help your baby fight off infections.  Your baby has a lower incidence of asthma, allergies, and sudden infant death syndrome.  The nutrients in breast milk are better for your baby than infant formulas and are designed uniquely for your baby's needs.  Breast milk improves your baby's brain development.  Your baby is less likely to develop other conditions, such as childhood obesity, asthma, or type 2 diabetes mellitus.  For You  Breastfeeding helps to create a very special bond between you and your baby.  Breastfeeding is convenient. Breast milk is always available at the correct temperature and costs nothing.  Breastfeeding helps to burn calories and helps you lose the weight gained during pregnancy.  Breastfeeding makes your uterus contract to its prepregnancy size faster and slows bleeding (lochia) after you give birth.  Breastfeeding helps  to lower your risk of developing type 2 diabetes mellitus, osteoporosis, and breast or ovarian cancer later in life.  Signs that your baby is hungry Early Signs of Hunger    Increased alertness or activity.  Stretching.  Movement of the head from side to side.  Movement of the head and opening of the mouth when the corner of the mouth or cheek is stroked (rooting).  Increased sucking sounds, smacking lips, cooing, sighing, or squeaking.  Hand-to-mouth movements.  Increased sucking of fingers or hands.  Late Signs of Hunger  Fussing.  Intermittent crying.  Extreme Signs of Hunger Signs of extreme hunger will require calming and consoling before your baby will be able to breastfeed successfully. Do not wait for the following signs of extreme hunger to occur before you initiate breastfeeding:  Restlessness.  A loud, strong cry.  Screaming.  Breastfeeding basics Breastfeeding Initiation  Find a comfortable place to sit or lie down, with your neck and back well supported.  Place a pillow or rolled up blanket under your baby to bring him or her to the level of your breast (if you are seated). Nursing pillows are specially designed to help support your arms and your baby while you breastfeed.  Make sure that your baby's abdomen is facing your abdomen.  Gently massage your breast. With your fingertips, massage from your chest wall toward your nipple in a circular motion. This encourages milk flow. You may need to continue this action during the feeding if your milk flows slowly.  Support your breast with 4 fingers underneath and your thumb above your nipple. Make sure your fingers are well away from your nipple and your baby's mouth.  Stroke your baby's lips gently with your finger or nipple.  When your baby's mouth is open wide enough, quickly bring your baby to your breast, placing your entire nipple and as much of the colored area around your nipple (areola) as possible into  your baby's mouth. ? More areola should be visible above your baby's upper lip than below the lower lip. ? Your baby's tongue should be between his or her lower gum and your breast.  Ensure that your baby's mouth is correctly positioned around your nipple (latched). Your baby's lips should create a seal on your breast and be turned out (everted).  It is common for your baby to suck about 2-3 minutes in order to start the flow of breast milk.  Latching Teaching your baby how to latch on to your breast properly is very important. An improper latch can cause nipple pain and decreased milk supply for you and poor weight gain in your baby. Also, if your baby is not latched onto your nipple properly, he or she may swallow some air during feeding. This can make your baby fussy. Burping your baby when you switch breasts during the feeding can help to get rid of the air. However, teaching your baby to latch on properly is still the best way to prevent fussiness from swallowing air while breastfeeding. Signs that your baby has successfully latched on to your nipple:  Silent tugging or silent sucking, without causing you pain.  Swallowing heard between every 3-4 sucks.  Muscle movement above and in front of his or her ears while sucking.  Signs that your baby has not successfully latched on to nipple:  Sucking sounds or smacking sounds from your baby while breastfeeding.  Nipple pain.  If you think your baby has not latched on correctly, slip your finger into the corner of your baby's mouth to break the suction and place it between your baby's gums. Attempt breastfeeding initiation again. Signs of Successful Breastfeeding Signs from your baby:  A   gradual decrease in the number of sucks or complete cessation of sucking.  Falling asleep.  Relaxation of his or her body.  Retention of a small amount of milk in his or her mouth.  Letting go of your breast by himself or herself.  Signs from  you:  Breasts that have increased in firmness, weight, and size 1-3 hours after feeding.  Breasts that are softer immediately after breastfeeding.  Increased milk volume, as well as a change in milk consistency and color by the fifth day of breastfeeding.  Nipples that are not sore, cracked, or bleeding.  Signs That Your Baby is Getting Enough Milk  Wetting at least 1-2 diapers during the first 24 hours after birth.  Wetting at least 5-6 diapers every 24 hours for the first week after birth. The urine should be clear or pale yellow by 5 days after birth.  Wetting 6-8 diapers every 24 hours as your baby continues to grow and develop.  At least 3 stools in a 24-hour period by age 5 days. The stool should be soft and yellow.  At least 3 stools in a 24-hour period by age 7 days. The stool should be seedy and yellow.  No loss of weight greater than 10% of birth weight during the first 3 days of age.  Average weight gain of 4-7 ounces (113-198 g) per week after age 4 days.  Consistent daily weight gain by age 5 days, without weight loss after the age of 2 weeks.  After a feeding, your baby may spit up a small amount. This is common. Breastfeeding frequency and duration Frequent feeding will help you make more milk and can prevent sore nipples and breast engorgement. Breastfeed when you feel the need to reduce the fullness of your breasts or when your baby shows signs of hunger. This is called "breastfeeding on demand." Avoid introducing a pacifier to your baby while you are working to establish breastfeeding (the first 4-6 weeks after your baby is born). After this time you may choose to use a pacifier. Research has shown that pacifier use during the first year of a baby's life decreases the risk of sudden infant death syndrome (SIDS). Allow your baby to feed on each breast as long as he or she wants. Breastfeed until your baby is finished feeding. When your baby unlatches or falls asleep  while feeding from the first breast, offer the second breast. Because newborns are often sleepy in the first few weeks of life, you may need to awaken your baby to get him or her to feed. Breastfeeding times will vary from baby to baby. However, the following rules can serve as a guide to help you ensure that your baby is properly fed:  Newborns (babies 4 weeks of age or younger) may breastfeed every 1-3 hours.  Newborns should not go longer than 3 hours during the day or 5 hours during the night without breastfeeding.  You should breastfeed your baby a minimum of 8 times in a 24-hour period until you begin to introduce solid foods to your baby at around 6 months of age.  Breast milk pumping Pumping and storing breast milk allows you to ensure that your baby is exclusively fed your breast milk, even at times when you are unable to breastfeed. This is especially important if you are going back to work while you are still breastfeeding or when you are not able to be present during feedings. Your lactation consultant can give you guidelines on how   long it is safe to store breast milk. A breast pump is a machine that allows you to pump milk from your breast into a sterile bottle. The pumped breast milk can then be stored in a refrigerator or freezer. Some breast pumps are operated by hand, while others use electricity. Ask your lactation consultant which type will work best for you. Breast pumps can be purchased, but some hospitals and breastfeeding support groups lease breast pumps on a monthly basis. A lactation consultant can teach you how to hand express breast milk, if you prefer not to use a pump. Caring for your breasts while you breastfeed Nipples can become dry, cracked, and sore while breastfeeding. The following recommendations can help keep your breasts moisturized and healthy:  Avoid using soap on your nipples.  Wear a supportive bra. Although not required, special nursing bras and tank  tops are designed to allow access to your breasts for breastfeeding without taking off your entire bra or top. Avoid wearing underwire-style bras or extremely tight bras.  Air dry your nipples for 3-4minutes after each feeding.  Use only cotton bra pads to absorb leaked breast milk. Leaking of breast milk between feedings is normal.  Use lanolin on your nipples after breastfeeding. Lanolin helps to maintain your skin's normal moisture barrier. If you use pure lanolin, you do not need to wash it off before feeding your baby again. Pure lanolin is not toxic to your baby. You may also hand express a few drops of breast milk and gently massage that milk into your nipples and allow the milk to air dry.  In the first few weeks after giving birth, some women experience extremely full breasts (engorgement). Engorgement can make your breasts feel heavy, warm, and tender to the touch. Engorgement peaks within 3-5 days after you give birth. The following recommendations can help ease engorgement:  Completely empty your breasts while breastfeeding or pumping. You may want to start by applying warm, moist heat (in the shower or with warm water-soaked hand towels) just before feeding or pumping. This increases circulation and helps the milk flow. If your baby does not completely empty your breasts while breastfeeding, pump any extra milk after he or she is finished.  Wear a snug bra (nursing or regular) or tank top for 1-2 days to signal your body to slightly decrease milk production.  Apply ice packs to your breasts, unless this is too uncomfortable for you.  Make sure that your baby is latched on and positioned properly while breastfeeding.  If engorgement persists after 48 hours of following these recommendations, contact your health care provider or a lactation consultant. Overall health care recommendations while breastfeeding  Eat healthy foods. Alternate between meals and snacks, eating 3 of each per  day. Because what you eat affects your breast milk, some of the foods may make your baby more irritable than usual. Avoid eating these foods if you are sure that they are negatively affecting your baby.  Drink milk, fruit juice, and water to satisfy your thirst (about 10 glasses a day).  Rest often, relax, and continue to take your prenatal vitamins to prevent fatigue, stress, and anemia.  Continue breast self-awareness checks.  Avoid chewing and smoking tobacco. Chemicals from cigarettes that pass into breast milk and exposure to secondhand smoke may harm your baby.  Avoid alcohol and drug use, including marijuana. Some medicines that may be harmful to your baby can pass through breast milk. It is important to ask your health care   provider before taking any medicine, including all over-the-counter and prescription medicine as well as vitamin and herbal supplements. It is possible to become pregnant while breastfeeding. If birth control is desired, ask your health care provider about options that will be safe for your baby. Contact a health care provider if:  You feel like you want to stop breastfeeding or have become frustrated with breastfeeding.  You have painful breasts or nipples.  Your nipples are cracked or bleeding.  Your breasts are red, tender, or warm.  You have a swollen area on either breast.  You have a fever or chills.  You have nausea or vomiting.  You have drainage other than breast milk from your nipples.  Your breasts do not become full before feedings by the fifth day after you give birth.  You feel sad and depressed.  Your baby is too sleepy to eat well.  Your baby is having trouble sleeping.  Your baby is wetting less than 3 diapers in a 24-hour period.  Your baby has less than 3 stools in a 24-hour period.  Your baby's skin or the white part of his or her eyes becomes yellow.  Your baby is not gaining weight by 5 days of age. Get help right away  if:  Your baby is overly tired (lethargic) and does not want to wake up and feed.  Your baby develops an unexplained fever. This information is not intended to replace advice given to you by your health care provider. Make sure you discuss any questions you have with your health care provider. Document Released: 07/13/2005 Document Revised: 12/25/2015 Document Reviewed: 01/04/2013 Elsevier Interactive Patient Education  2017 Elsevier Inc.  

## 2017-01-18 NOTE — Progress Notes (Signed)
[employer] interpreter Ammar [id number] 

## 2017-01-19 LAB — HIV ANTIBODY (ROUTINE TESTING W REFLEX): HIV Screen 4th Generation wRfx: NONREACTIVE

## 2017-01-19 LAB — RPR: RPR Ser Ql: NONREACTIVE

## 2017-01-19 LAB — CBC
HEMOGLOBIN: 10.8 g/dL — AB (ref 11.1–15.9)
Hematocrit: 33.2 % — ABNORMAL LOW (ref 34.0–46.6)
MCH: 25.8 pg — AB (ref 26.6–33.0)
MCHC: 32.5 g/dL (ref 31.5–35.7)
MCV: 79 fL (ref 79–97)
PLATELETS: 237 10*3/uL (ref 150–379)
RBC: 4.19 x10E6/uL (ref 3.77–5.28)
RDW: 14.3 % (ref 12.3–15.4)
WBC: 8.3 10*3/uL (ref 3.4–10.8)

## 2017-01-19 LAB — GLUCOSE TOLERANCE, 2 HOURS W/ 1HR
GLUCOSE, 1 HOUR: 162 mg/dL (ref 65–179)
GLUCOSE, 2 HOUR: 114 mg/dL (ref 65–152)
Glucose, Fasting: 73 mg/dL (ref 65–91)

## 2017-01-25 ENCOUNTER — Other Ambulatory Visit: Payer: Self-pay | Admitting: Obstetrics & Gynecology

## 2017-02-05 ENCOUNTER — Ambulatory Visit (INDEPENDENT_AMBULATORY_CARE_PROVIDER_SITE_OTHER): Payer: Medicaid Other | Admitting: Obstetrics and Gynecology

## 2017-02-05 VITALS — BP 110/74 | HR 98 | Wt 187.4 lb

## 2017-02-05 DIAGNOSIS — O09523 Supervision of elderly multigravida, third trimester: Secondary | ICD-10-CM

## 2017-02-05 DIAGNOSIS — Z789 Other specified health status: Secondary | ICD-10-CM

## 2017-02-05 DIAGNOSIS — O34219 Maternal care for unspecified type scar from previous cesarean delivery: Secondary | ICD-10-CM

## 2017-02-05 DIAGNOSIS — O0993 Supervision of high risk pregnancy, unspecified, third trimester: Secondary | ICD-10-CM

## 2017-02-05 LAB — POCT URINALYSIS DIP (DEVICE)
BILIRUBIN URINE: NEGATIVE
Glucose, UA: NEGATIVE mg/dL
KETONES UR: NEGATIVE mg/dL
LEUKOCYTES UA: NEGATIVE
NITRITE: NEGATIVE
PH: 6.5 (ref 5.0–8.0)
Protein, ur: 30 mg/dL — AB
Specific Gravity, Urine: 1.02 (ref 1.005–1.030)
Urobilinogen, UA: 0.2 mg/dL (ref 0.0–1.0)

## 2017-02-05 NOTE — Progress Notes (Signed)
Subjective:  Cheryl Bauer is a 41 y.o. [id number] at 361w1d being seen today for ongoing prenatal care.  She is currently monitored for the following issues for this high-risk pregnancy and has Language barrier, speaks Arabic; Supervision of high-risk pregnancy; Advanced maternal age in multigravida; History of cesarean delivery x 3, currently pregnant; Neonatal death in prior pregnancy, currently pregnant; and Conductive hearing loss of right ear with unrestricted hearing of left ear on her problem list.  Patient reports no complaints.  Contractions: Not present. Vag. Bleeding: None.  Movement: Present. Denies leaking of fluid.   The following portions of the patient's history were reviewed and updated as appropriate: allergies, current medications, past family history, past medical history, past social history, past surgical history and problem list. Problem list updated.  Objective:   Vitals:   02/05/17 1014  BP: 110/74  Pulse: 98  Weight: 187 lb 6.4 oz (85 kg)    Fetal Status: Fetal Heart Rate (bpm): 133   Movement: Present     General:  Alert, oriented and cooperative. Patient is in no acute distress.  Skin: Skin is warm and dry. No rash noted.   Cardiovascular: Normal heart rate noted  Respiratory: Normal respiratory effort, no problems with respiration noted  Abdomen: Soft, gravid, appropriate for gestational age. Pain/Pressure: Absent     Pelvic:  Cervical exam deferred        Extremities: Normal range of motion.     Mental Status: Normal mood and affect. Normal behavior. Normal judgment and thought content.   Urinalysis:      Assessment and Plan:  Pregnancy: G6P5004 at 341w1d  1. Language barrier, speaks Arabic Interrupter services used   2. Supervision of high risk pregnancy in third trimester Stable  3. History of cesarean delivery x 3, currently pregnant Repeat c section scheduled  4. Elderly multigravida in third trimester   Preterm labor symptoms and general  obstetric precautions including but not limited to vaginal bleeding, contractions, leaking of fluid and fetal movement were reviewed in detail with the patient. Please refer to After Visit Summary for other counseling recommendations.  Return in about 2 weeks (around 02/19/2017) for OB visit.   Hermina StaggersErvin, Marcoantonio Legault L, MD

## 2017-02-23 ENCOUNTER — Ambulatory Visit (INDEPENDENT_AMBULATORY_CARE_PROVIDER_SITE_OTHER): Payer: Medicaid Other | Admitting: Obstetrics and Gynecology

## 2017-02-23 ENCOUNTER — Other Ambulatory Visit (HOSPITAL_COMMUNITY)
Admission: RE | Admit: 2017-02-23 | Discharge: 2017-02-23 | Disposition: A | Discharge status: Home / Self Care | Payer: Medicaid Other | Source: Ambulatory Visit | Attending: Obstetrics and Gynecology | Admitting: Obstetrics and Gynecology

## 2017-02-23 VITALS — BP 129/80 | HR 103 | Wt 190.0 lb

## 2017-02-23 DIAGNOSIS — O34219 Maternal care for unspecified type scar from previous cesarean delivery: Secondary | ICD-10-CM | POA: Diagnosis not present

## 2017-02-23 DIAGNOSIS — O0993 Supervision of high risk pregnancy, unspecified, third trimester: Secondary | ICD-10-CM | POA: Diagnosis present

## 2017-02-23 DIAGNOSIS — Z3A35 35 weeks gestation of pregnancy: Secondary | ICD-10-CM | POA: Insufficient documentation

## 2017-02-23 DIAGNOSIS — Z113 Encounter for screening for infections with a predominantly sexual mode of transmission: Secondary | ICD-10-CM | POA: Diagnosis not present

## 2017-02-23 DIAGNOSIS — Z789 Other specified health status: Secondary | ICD-10-CM | POA: Diagnosis not present

## 2017-02-23 DIAGNOSIS — O09523 Supervision of elderly multigravida, third trimester: Secondary | ICD-10-CM | POA: Diagnosis not present

## 2017-02-23 NOTE — Progress Notes (Signed)
   PRENATAL VISIT NOTE  Subjective:  Cheryl Bauer is a 41 y.o. [id number] at 4234w5d being seen today for ongoing prenatal care.  She is currently monitored for the following issues for this high-risk pregnancy and has Language barrier, speaks Arabic; Supervision of high-risk pregnancy; Advanced maternal age in multigravida; History of cesarean delivery x 3, currently pregnant; Neonatal death in prior pregnancy, currently pregnant; and Conductive hearing loss of right ear with unrestricted hearing of left ear on her problem list.  Patient reports no complaints.  Contractions: Not present. Vag. Bleeding: None.  Movement: Present. Denies leaking of fluid.   The following portions of the patient's history were reviewed and updated as appropriate: allergies, current medications, past family history, past medical history, past social history, past surgical history and problem list. Problem list updated.  Objective:   Vitals:   02/23/17 1257  BP: 129/80  Pulse: (!) 103  Weight: 190 lb (86.2 kg)    Fetal Status: Fetal Heart Rate (bpm): 148 Fundal Height: 36 cm Movement: Present  Presentation: Vertex  General:  Alert, oriented and cooperative. Patient is in no acute distress.  Skin: Skin is warm and dry. No rash noted.   Cardiovascular: Normal heart rate noted  Respiratory: Normal respiratory effort, no problems with respiration noted  Abdomen: Soft, gravid, appropriate for gestational age.  Pain/Pressure: Absent     Pelvic: Cervical exam performed Dilation: Fingertip Effacement (%): Thick Station: Ballotable  Extremities: Normal range of motion.  Edema: None  Mental Status:  Normal mood and affect. Normal behavior. Normal judgment and thought content.   Assessment and Plan:  Pregnancy: G6P5004 at 4234w5d  1. History of cesarean delivery x 3, currently pregnant Patient scheduled for repeat c-section in 8/23  2. Supervision of high risk pregnancy in third trimester Patient is doing well  without complaints Cultures today - Strep Gp B NAA - GC[facility]Chlamydia probe amp [facility]  3. Language barrier, speaks Arabic Interpreter present  4. Elderly multigravida in third trimester Declined testing  Preterm labor symptoms and general obstetric precautions including but not limited to vaginal bleeding, contractions, leaking of fluid and fetal movement were reviewed in detail with the patient. Please refer to After Visit Summary for other counseling recommendations.  Return in about 1 week (around 03/02/2017) for ROB.   Catalina AntiguaPeggy Jadarrius Maselli, MD

## 2017-02-24 LAB — GC/CHLAMYDIA PROBE AMP (~~LOC~~) NOT AT ARMC
Chlamydia: NEGATIVE
NEISSERIA GONORRHEA: NEGATIVE

## 2017-02-25 LAB — STREP GP B NAA: STREP GROUP B AG: NEGATIVE

## 2017-03-01 ENCOUNTER — Ambulatory Visit (INDEPENDENT_AMBULATORY_CARE_PROVIDER_SITE_OTHER): Payer: Medicaid Other | Admitting: Obstetrics & Gynecology

## 2017-03-01 VITALS — BP 122/69 | HR 101 | Wt 190.2 lb

## 2017-03-01 DIAGNOSIS — O09293 Supervision of pregnancy with other poor reproductive or obstetric history, third trimester: Secondary | ICD-10-CM

## 2017-03-01 DIAGNOSIS — Z789 Other specified health status: Secondary | ICD-10-CM

## 2017-03-01 DIAGNOSIS — O34219 Maternal care for unspecified type scar from previous cesarean delivery: Secondary | ICD-10-CM

## 2017-03-01 DIAGNOSIS — O09299 Supervision of pregnancy with other poor reproductive or obstetric history, unspecified trimester: Secondary | ICD-10-CM

## 2017-03-01 NOTE — Progress Notes (Signed)
States edema a little worse. GAD 7 elevated slightly - offered to see Asher MuirJamie , our behaviroal health clinician- declines to see Asher MuirJamie. States she is fine.

## 2017-03-01 NOTE — Progress Notes (Signed)
Discussed her scheduled cesarean section on 8/23 and she understands I am performing the operation   PRENATAL VISIT NOTE  Subjective:  Cheryl Bauer is a 41 y.o. [id number] at 168w4d being seen today for ongoing prenatal care.  She is currently monitored for the following issues for this low-risk pregnancy and has Language barrier, speaks Arabic; Supervision of high-risk pregnancy; Advanced maternal age in multigravida; History of cesarean delivery x 3, currently pregnant; Neonatal death in prior pregnancy, currently pregnant; and Conductive hearing loss of right ear with unrestricted hearing of left ear on her problem list.  Patient reports no complaints.  Contractions: Not present. Vag. Bleeding: None.  Movement: Present. Denies leaking of fluid.   The following portions of the patient's history were reviewed and updated as appropriate: allergies, current medications, past family history, past medical history, past social history, past surgical history and problem list. Problem list updated.  Objective:   Vitals:   03/01/17 1134  BP: 122/69  Pulse: (!) 101  Weight: 190 lb 3.2 oz (86.3 kg)    Fetal Status: Fetal Heart Rate (bpm): 144   Movement: Present     General:  Alert, oriented and cooperative. Patient is in no acute distress.  Skin: Skin is warm and dry. No rash noted.   Cardiovascular: Normal heart rate noted  Respiratory: Normal respiratory effort, no problems with respiration noted  Abdomen: Soft, gravid, appropriate for gestational age.  Pain/Pressure: Present     Pelvic: Cervical exam deferred        Extremities: Normal range of motion.  Edema: Trace  Mental Status:  Normal mood and affect. Normal behavior. Normal judgment and thought content.   Assessment and Plan:  Pregnancy: G6P5004 at [redacted]w[redacted]d  1. Neonatal death in prior pregnancy, currently pregnant   2. History of cesarean delivery x 3, currently pregnant Repeat at 39 weeks  3. Language barrier, speaks  Arabic Interpreter present for her visit  Preterm labor symptoms and general obstetric precautions including but not limited to vaginal bleeding, contractions, leaking of fluid and fetal movement were reviewed in detail with the patient. Please refer to After Visit Summary for other counseling recommendations.  Return in about 1 week (around 03/08/2017).   Scheryl DarterJames Arnold, MD

## 2017-03-01 NOTE — Patient Instructions (Signed)
Cesarean Delivery °Cesarean birth, or cesarean delivery, is the surgical delivery of a baby through an incision in the abdomen and the uterus. This may be referred to as a C-section. This procedure may be scheduled ahead of time, or it may be done in an emergency situation. °Tell a health care provider about: °· Any allergies you have. °· All medicines you are taking, including vitamins, herbs, eye drops, creams, and over-the-counter medicines. °· Any problems you or family members have had with anesthetic medicines. °· Any blood disorders you have. °· Any surgeries you have had. °· Any medical conditions you have. °· Whether you or any members of your family have a history of deep vein thrombosis (DVT) or pulmonary embolism (PE). °What are the risks? °Generally, this is a safe procedure. However, problems may occur, including: °· Infection. °· Bleeding. °· Allergic reactions to medicines. °· Damage to other structures or organs. °· Blood clots. °· Injury to your baby. ° °What happens before the procedure? °· Follow instructions from your health care provider about eating or drinking restrictions. °· Follow instructions from your health care provider about bathing before your procedure to help reduce your risk of infection. °· If you know that you are going to have a cesarean delivery, do not shave your pubic area. Shaving before the procedure may increase your risk of infection. °· Ask your health care provider about: °? Changing or stopping your regular medicines. This is especially important if you are taking diabetes medicines or blood thinners. °? Your pain management plan. This is especially important if you plan to breastfeed your baby. °? How long you will be in the hospital after the procedure. °? Any concerns you may have about receiving blood products if you need them during the procedure. °? Cord blood banking, if you plan to collect your baby’s umbilical cord blood. °· You may also want to ask your  health care provider: °? Whether you will be able to hold or breastfeed your baby while you are still in the operating room. °? Whether your baby can stay with you immediately after the procedure and during your recovery. °? Whether a family member or a person of your choice can go with you into the operating room and stay with you during the procedure, immediately after the procedure, and during your recovery. °· Plan to have someone drive you home when you are discharged from the hospital. °What happens during the procedure? °· Fetal monitors will be placed on your abdomen to monitor your heart rate and your baby's heart rate. °· Depending on the reason for your cesarean delivery, you may have a physical exam or additional testing, such as an ultrasound. °· An IV tube will be inserted into one of your veins. °· You may have your blood or urine tested. °· You will be given antibiotic medicine to help prevent infection. °· You may be given a special warming gown to wear to keep your temperature stable. °· Hair may be removed from your pubic area. °· The skin of your pubic area and lower abdomen will be cleaned with a germ-killing solution (antiseptic). °· A catheter may be inserted into your bladder through your urethra. This drains your urine during the procedure. °· You may be given one or more of the following: °? A medicine to numb the area (local anesthetic). °? A medicine to make you fall asleep (general anesthetic). °? A medicine (regional anesthetic) that is injected into your back or through a small   thin tube placed in your back (spinal anesthetic or epidural anesthetic). This numbs everything below the injection site and allows you to stay awake during your procedure. If this makes you feel nauseous, tell your health care provider. Medicines will be available to help reduce any nausea you may feel. °· An incision will be made in your abdomen, and then in your uterus. °· If you are awake during your  procedure, you may feel tugging and pulling in your abdomen, but you should not feel pain. If you feel pain, tell your health care provider immediately. °· Your baby will be removed from your uterus. You may feel more pressure or pushing while this happens. °· Immediately after birth, your baby will be dried and kept warm. You may be able to hold and breastfeed your baby. The umbilical cord may be clamped and cut during this time. °· Your placenta will be removed from your uterus. °· Your incisions will be closed with stitches (sutures). Staples, skin glue, or adhesive strips may also be applied to the incision in your abdomen. °· Bandages (dressings) will be placed over the incision in your abdomen. °The procedure may vary among health care providers and hospitals. °What happens after the procedure? °· Your blood pressure, heart rate, breathing rate, and blood oxygen level will be monitored often until the medicines you were given have worn off. °· You may continue to receive fluids and medicines through an IV tube. °· You will have some pain. Medicines will be available to help control your pain. °· To help prevent blood clots: °? You may be given medicines. °? You may have to wear compression stockings or devices. °? You will be encouraged to walk around when you are able. °· Hospital staff will encourage and support bonding with your baby. Your hospital may allow you and your baby to stay in the same room (rooming in) during your hospital stay to encourage successful breastfeeding. °· You may be encouraged to cough and breathe deeply often. This helps to prevent lung problems. °· If you have a catheter draining your urine, it will be removed as soon as possible after your procedure. °This information is not intended to replace advice given to you by your health care provider. Make sure you discuss any questions you have with your health care provider. °Document Released: 07/13/2005 Document Revised: 12/19/2015  Document Reviewed: 04/23/2015 °Elsevier Interactive Patient Education © 2017 Elsevier Inc. ° °

## 2017-03-03 ENCOUNTER — Encounter (HOSPITAL_COMMUNITY): Payer: Self-pay

## 2017-03-03 NOTE — Pre-Procedure Instructions (Signed)
Interpreter number [id number] 

## 2017-03-11 ENCOUNTER — Ambulatory Visit (INDEPENDENT_AMBULATORY_CARE_PROVIDER_SITE_OTHER): Payer: Medicaid Other | Admitting: Obstetrics & Gynecology

## 2017-03-11 VITALS — BP 115/73 | HR 85 | Wt 186.7 lb

## 2017-03-11 DIAGNOSIS — O34219 Maternal care for unspecified type scar from previous cesarean delivery: Secondary | ICD-10-CM

## 2017-03-11 DIAGNOSIS — O0993 Supervision of high risk pregnancy, unspecified, third trimester: Secondary | ICD-10-CM

## 2017-03-11 NOTE — Patient Instructions (Signed)
Cesarean Delivery °Cesarean birth, or cesarean delivery, is the surgical delivery of a baby through an incision in the abdomen and the uterus. This may be referred to as a C-section. This procedure may be scheduled ahead of time, or it may be done in an emergency situation. °Tell a health care provider about: °· Any allergies you have. °· All medicines you are taking, including vitamins, herbs, eye drops, creams, and over-the-counter medicines. °· Any problems you or family members have had with anesthetic medicines. °· Any blood disorders you have. °· Any surgeries you have had. °· Any medical conditions you have. °· Whether you or any members of your family have a history of deep vein thrombosis (DVT) or pulmonary embolism (PE). °What are the risks? °Generally, this is a safe procedure. However, problems may occur, including: °· Infection. °· Bleeding. °· Allergic reactions to medicines. °· Damage to other structures or organs. °· Blood clots. °· Injury to your baby. ° °What happens before the procedure? °· Follow instructions from your health care provider about eating or drinking restrictions. °· Follow instructions from your health care provider about bathing before your procedure to help reduce your risk of infection. °· If you know that you are going to have a cesarean delivery, do not shave your pubic area. Shaving before the procedure may increase your risk of infection. °· Ask your health care provider about: °? Changing or stopping your regular medicines. This is especially important if you are taking diabetes medicines or blood thinners. °? Your pain management plan. This is especially important if you plan to breastfeed your baby. °? How long you will be in the hospital after the procedure. °? Any concerns you may have about receiving blood products if you need them during the procedure. °? Cord blood banking, if you plan to collect your baby’s umbilical cord blood. °· You may also want to ask your  health care provider: °? Whether you will be able to hold or breastfeed your baby while you are still in the operating room. °? Whether your baby can stay with you immediately after the procedure and during your recovery. °? Whether a family member or a person of your choice can go with you into the operating room and stay with you during the procedure, immediately after the procedure, and during your recovery. °· Plan to have someone drive you home when you are discharged from the hospital. °What happens during the procedure? °· Fetal monitors will be placed on your abdomen to monitor your heart rate and your baby's heart rate. °· Depending on the reason for your cesarean delivery, you may have a physical exam or additional testing, such as an ultrasound. °· An IV tube will be inserted into one of your veins. °· You may have your blood or urine tested. °· You will be given antibiotic medicine to help prevent infection. °· You may be given a special warming gown to wear to keep your temperature stable. °· Hair may be removed from your pubic area. °· The skin of your pubic area and lower abdomen will be cleaned with a germ-killing solution (antiseptic). °· A catheter may be inserted into your bladder through your urethra. This drains your urine during the procedure. °· You may be given one or more of the following: °? A medicine to numb the area (local anesthetic). °? A medicine to make you fall asleep (general anesthetic). °? A medicine (regional anesthetic) that is injected into your back or through a small   thin tube placed in your back (spinal anesthetic or epidural anesthetic). This numbs everything below the injection site and allows you to stay awake during your procedure. If this makes you feel nauseous, tell your health care provider. Medicines will be available to help reduce any nausea you may feel. °· An incision will be made in your abdomen, and then in your uterus. °· If you are awake during your  procedure, you may feel tugging and pulling in your abdomen, but you should not feel pain. If you feel pain, tell your health care provider immediately. °· Your baby will be removed from your uterus. You may feel more pressure or pushing while this happens. °· Immediately after birth, your baby will be dried and kept warm. You may be able to hold and breastfeed your baby. The umbilical cord may be clamped and cut during this time. °· Your placenta will be removed from your uterus. °· Your incisions will be closed with stitches (sutures). Staples, skin glue, or adhesive strips may also be applied to the incision in your abdomen. °· Bandages (dressings) will be placed over the incision in your abdomen. °The procedure may vary among health care providers and hospitals. °What happens after the procedure? °· Your blood pressure, heart rate, breathing rate, and blood oxygen level will be monitored often until the medicines you were given have worn off. °· You may continue to receive fluids and medicines through an IV tube. °· You will have some pain. Medicines will be available to help control your pain. °· To help prevent blood clots: °? You may be given medicines. °? You may have to wear compression stockings or devices. °? You will be encouraged to walk around when you are able. °· Hospital staff will encourage and support bonding with your baby. Your hospital may allow you and your baby to stay in the same room (rooming in) during your hospital stay to encourage successful breastfeeding. °· You may be encouraged to cough and breathe deeply often. This helps to prevent lung problems. °· If you have a catheter draining your urine, it will be removed as soon as possible after your procedure. °This information is not intended to replace advice given to you by your health care provider. Make sure you discuss any questions you have with your health care provider. °Document Released: 07/13/2005 Document Revised: 12/19/2015  Document Reviewed: 04/23/2015 °Elsevier Interactive Patient Education © 2017 Elsevier Inc. ° °

## 2017-03-11 NOTE — Progress Notes (Signed)
Audio interpreter arabic   PRENATAL VISIT NOTE  Subjective:  Cheryl Bauer is a 41 y.o. G6P5004 at 6167w0d being seen today for ongoing prenatal care.  She is currently monitored for the following issues for this high-risk pregnancy and has Language barrier, speaks Arabic; Supervision of high-risk pregnancy; Advanced maternal age in multigravida; History of cesarean delivery x 3, currently pregnant; Neonatal death in prior pregnancy, currently pregnant; and Conductive hearing loss of right ear with unrestricted hearing of left ear on her problem list.  Patient reports no complaints.  Contractions: Not present. Vag. Bleeding: None.  Movement: Present. Denies leaking of fluid.   The following portions of the patient's history were reviewed and updated as appropriate: allergies, current medications, past family history, past medical history, past social history, past surgical history and problem list. Problem list updated.  Objective:   Vitals:   03/11/17 1042  BP: 115/73  Pulse: 85  Weight: 186 lb 11.2 oz (84.7 kg)    Fetal Status: Fetal Heart Rate (bpm): 138   Movement: Present     General:  Alert, oriented and cooperative. Patient is in no acute distress.  Skin: Skin is warm and dry. No rash noted.   Cardiovascular: Normal heart rate noted  Respiratory: Normal respiratory effort, no problems with respiration noted  Abdomen: Soft, gravid, appropriate for gestational age.  Pain/Pressure: Absent     Pelvic: Cervical exam deferred        Extremities: Normal range of motion.  Edema: Mild pitting, slight indentation  Mental Status:  Normal mood and affect. Normal behavior. Normal judgment and thought content.   Assessment and Plan:  Pregnancy: G6P5004 at 9467w0d  1. Supervision of high risk pregnancy in third trimester   2. History of cesarean delivery x 3, currently pregnant RCS in 1 week, she has her appt for preop  Preterm labor symptoms and general obstetric precautions including  but not limited to vaginal bleeding, contractions, leaking of fluid and fetal movement were reviewed in detail with the patient. Please refer to After Visit Summary for other counseling recommendations.  Return if symptoms worsen or fail to improve, for postpartum.   Scheryl DarterJames Arnold, MD

## 2017-03-17 ENCOUNTER — Encounter (HOSPITAL_COMMUNITY)
Admission: RE | Admit: 2017-03-17 | Discharge: 2017-03-17 | Disposition: A | Discharge status: Home / Self Care | Payer: Medicaid Other | Source: Ambulatory Visit | Attending: Obstetrics & Gynecology | Admitting: Obstetrics & Gynecology

## 2017-03-17 LAB — CBC
HEMATOCRIT: 33.5 % — AB (ref 36.0–46.0)
Hemoglobin: 11.2 g/dL — ABNORMAL LOW (ref 12.0–15.0)
MCH: 26.5 pg (ref 26.0–34.0)
MCHC: 33.4 g/dL (ref 30.0–36.0)
MCV: 79.4 fL (ref 78.0–100.0)
Platelets: 246 10*3/uL (ref 150–400)
RBC: 4.22 MIL/uL (ref 3.87–5.11)
RDW: 14.5 % (ref 11.5–15.5)
WBC: 9 10*3/uL (ref 4.0–10.5)

## 2017-03-17 LAB — TYPE AND SCREEN
ABO/RH(D): A POS
Antibody Screen: NEGATIVE

## 2017-03-17 LAB — ABO/RH: ABO/RH(D): A POS

## 2017-03-17 NOTE — Patient Instructions (Signed)
22 Cheryl Bauer  03/17/2017   Your procedure is scheduled on:  03/18/2017  Enter through the Main Entrance of [facility] at 0930 AM.  Pick up the phone at the desk and dial [phone].   Call this number if you have problems the morning of surgery: [phone]   Remember:   Do not eat food:After Midnight.  Do not drink clear liquids: After Midnight.  Take these medicines the morning of surgery with A SIP OF WATER: none   Do not wear jewelry, make-up or nail polish.  Do not wear lotions, powders, or perfumes. Do not wear deodorant.  Do not shave 48 hours prior to surgery.  Do not bring valuables to the hospital.  [facility] is not   responsible for any belongings or valuables brought to the hospital.  Contacts, dentures or bridgework may not be worn into surgery.  Leave suitcase in the car. After surgery it may be brought to your room.  For patients admitted to the hospital, checkout time is 11:00 AM the day of              discharge.   Patients discharged the day of surgery will not be allowed to drive             home.  Name and phone number of your driver: na  Special Instructions:   N/A   Please read over the following fact sheets that you were given:   Surgical Site Infection Prevention

## 2017-03-18 ENCOUNTER — Encounter (HOSPITAL_COMMUNITY): Payer: Self-pay | Admitting: *Deleted

## 2017-03-18 ENCOUNTER — Inpatient Hospital Stay (HOSPITAL_COMMUNITY): Payer: Medicaid Other | Admitting: Anesthesiology

## 2017-03-18 ENCOUNTER — Encounter (HOSPITAL_COMMUNITY)
Admission: RE | Disposition: A | Discharge status: Home / Self Care | Payer: Self-pay | Source: Ambulatory Visit | Attending: Obstetrics & Gynecology

## 2017-03-18 ENCOUNTER — Inpatient Hospital Stay (HOSPITAL_COMMUNITY)
Admission: RE | Admit: 2017-03-18 | Discharge: 2017-03-20 | DRG: 766 | Disposition: A | Discharge status: Home / Self Care | Payer: Medicaid Other | Source: Ambulatory Visit | Attending: Obstetrics & Gynecology | Admitting: Obstetrics & Gynecology

## 2017-03-18 DIAGNOSIS — O34211 Maternal care for low transverse scar from previous cesarean delivery: Principal | ICD-10-CM | POA: Diagnosis present

## 2017-03-18 DIAGNOSIS — Z98891 History of uterine scar from previous surgery: Secondary | ICD-10-CM

## 2017-03-18 DIAGNOSIS — H9011 Conductive hearing loss, unilateral, right ear, with unrestricted hearing on the contralateral side: Secondary | ICD-10-CM | POA: Diagnosis present

## 2017-03-18 DIAGNOSIS — Z87891 Personal history of nicotine dependence: Secondary | ICD-10-CM

## 2017-03-18 DIAGNOSIS — Z3A39 39 weeks gestation of pregnancy: Secondary | ICD-10-CM

## 2017-03-18 HISTORY — DX: History of uterine scar from previous surgery: Z98.891

## 2017-03-18 LAB — RPR: RPR: NONREACTIVE

## 2017-03-18 SURGERY — Surgical Case
Anesthesia: Spinal

## 2017-03-18 MED ORDER — PRENATAL MULTIVITAMIN CH
1.0000 | ORAL_TABLET | Freq: Every day | ORAL | Status: DC
  Administered 2017-03-19 – 2017-03-20 (×2): 1 via ORAL
  Filled 2017-03-18 (×2): qty 1

## 2017-03-18 MED ORDER — ONDANSETRON HCL 4 MG/2ML IJ SOLN
INTRAMUSCULAR | Status: DC | PRN
  Administered 2017-03-18: 4 mg via INTRAVENOUS

## 2017-03-18 MED ORDER — CEFAZOLIN SODIUM-DEXTROSE 2-4 GM/100ML-% IV SOLN
2.0000 g | INTRAVENOUS | Status: AC
  Administered 2017-03-18: 2 g via INTRAVENOUS

## 2017-03-18 MED ORDER — WITCH HAZEL-GLYCERIN EX PADS: 1.0000 "application " | MEDICATED_PAD | CUTANEOUS | Status: DC | PRN

## 2017-03-18 MED ORDER — SENNOSIDES-DOCUSATE SODIUM 8.6-50 MG PO TABS
2.0000 | ORAL_TABLET | ORAL | Status: DC
  Administered 2017-03-18 – 2017-03-19 (×2): 2 via ORAL
  Filled 2017-03-18 (×2): qty 2

## 2017-03-18 MED ORDER — OXYCODONE HCL 5 MG PO TABS: 5.0000 mg | ORAL_TABLET | Freq: Once | ORAL | Status: DC | PRN

## 2017-03-18 MED ORDER — PHENYLEPHRINE 8 MG IN D5W 100 ML (0.08MG/ML) PREMIX OPTIME
INJECTION | INTRAVENOUS | Status: AC
  Filled 2017-03-18: qty 100

## 2017-03-18 MED ORDER — FENTANYL CITRATE (PF) 100 MCG/2ML IJ SOLN
INTRAMUSCULAR | Status: DC | PRN
  Administered 2017-03-18: 40 ug via INTRAVENOUS
  Administered 2017-03-18: 50 ug via INTRAVENOUS
  Administered 2017-03-18: 10 ug via INTRATHECAL

## 2017-03-18 MED ORDER — LACTATED RINGERS IV SOLN: INTRAVENOUS | Status: DC

## 2017-03-18 MED ORDER — LACTATED RINGERS IV SOLN
INTRAVENOUS | Status: DC
  Administered 2017-03-18: 11:00:00 via INTRAVENOUS

## 2017-03-18 MED ORDER — ZOLPIDEM TARTRATE 5 MG PO TABS: 5.0000 mg | ORAL_TABLET | Freq: Every evening | ORAL | Status: DC | PRN

## 2017-03-18 MED ORDER — OXYCODONE HCL 5 MG/5ML PO SOLN
5.0000 mg | Freq: Once | ORAL | Status: DC | PRN
Start: 2017-03-18 — End: 2017-03-18

## 2017-03-18 MED ORDER — BUPIVACAINE HCL (PF) 0.5 % IJ SOLN
INTRAMUSCULAR | Status: AC
  Filled 2017-03-18: qty 30

## 2017-03-18 MED ORDER — ACETAMINOPHEN 325 MG PO TABS
650.0000 mg | ORAL_TABLET | ORAL | Status: DC | PRN
  Administered 2017-03-19 – 2017-03-20 (×4): 650 mg via ORAL
  Filled 2017-03-18 (×4): qty 2

## 2017-03-18 MED ORDER — OXYCODONE HCL 5 MG PO TABS: 5.0000 mg | ORAL_TABLET | ORAL | Status: DC | PRN

## 2017-03-18 MED ORDER — MORPHINE SULFATE (PF) 0.5 MG/ML IJ SOLN
INTRAMUSCULAR | Status: DC | PRN
  Administered 2017-03-18: .2 mg via EPIDURAL

## 2017-03-18 MED ORDER — LACTATED RINGERS IV SOLN
INTRAVENOUS | Status: DC
  Administered 2017-03-19: 07:00:00 via INTRAVENOUS

## 2017-03-18 MED ORDER — BUPIVACAINE HCL (PF) 0.5 % IJ SOLN
INTRAMUSCULAR | Status: DC | PRN
  Administered 2017-03-18: 30 mL

## 2017-03-18 MED ORDER — FENTANYL CITRATE (PF) 100 MCG/2ML IJ SOLN
INTRAMUSCULAR | Status: AC
  Filled 2017-03-18: qty 2

## 2017-03-18 MED ORDER — BUPIVACAINE IN DEXTROSE 0.75-8.25 % IT SOLN
INTRATHECAL | Status: AC
  Filled 2017-03-18: qty 2

## 2017-03-18 MED ORDER — DIBUCAINE 1 % RE OINT: 1.0000 "application " | TOPICAL_OINTMENT | RECTAL | Status: DC | PRN

## 2017-03-18 MED ORDER — SCOPOLAMINE 1 MG/3DAYS TD PT72
MEDICATED_PATCH | TRANSDERMAL | Status: AC
  Filled 2017-03-18: qty 1

## 2017-03-18 MED ORDER — HYDROMORPHONE HCL 1 MG/ML IJ SOLN
0.2500 mg | INTRAMUSCULAR | Status: DC | PRN
  Administered 2017-03-18: 0.5 mg via INTRAVENOUS

## 2017-03-18 MED ORDER — DEXAMETHASONE SODIUM PHOSPHATE 4 MG/ML IJ SOLN
INTRAMUSCULAR | Status: AC
  Filled 2017-03-18: qty 1

## 2017-03-18 MED ORDER — BUPIVACAINE IN DEXTROSE 0.75-8.25 % IT SOLN
INTRATHECAL | Status: DC | PRN
  Administered 2017-03-18: 1.6 mL via INTRATHECAL

## 2017-03-18 MED ORDER — NALBUPHINE HCL 10 MG/ML IJ SOLN
5.0000 mg | Freq: Once | INTRAMUSCULAR | Status: AC
  Administered 2017-03-18: 5 mg via INTRAMUSCULAR

## 2017-03-18 MED ORDER — SIMETHICONE 80 MG PO CHEW
80.0000 mg | CHEWABLE_TABLET | ORAL | Status: DC
  Administered 2017-03-18 – 2017-03-19 (×2): 80 mg via ORAL
  Filled 2017-03-18 (×2): qty 1

## 2017-03-18 MED ORDER — ONDANSETRON HCL 4 MG/2ML IJ SOLN
INTRAMUSCULAR | Status: AC
  Filled 2017-03-18: qty 2

## 2017-03-18 MED ORDER — OXYTOCIN 10 UNIT/ML IJ SOLN
INTRAVENOUS | Status: DC | PRN
  Administered 2017-03-18: 40 [IU] via INTRAVENOUS

## 2017-03-18 MED ORDER — HYDROMORPHONE HCL 1 MG/ML IJ SOLN
INTRAMUSCULAR | Status: AC
  Filled 2017-03-18: qty 0.5

## 2017-03-18 MED ORDER — SCOPOLAMINE 1 MG/3DAYS TD PT72
1.0000 | MEDICATED_PATCH | Freq: Once | TRANSDERMAL | Status: DC
  Administered 2017-03-18: 1.5 mg via TRANSDERMAL

## 2017-03-18 MED ORDER — PHENYLEPHRINE 8 MG IN D5W 100 ML (0.08MG/ML) PREMIX OPTIME
INJECTION | INTRAVENOUS | Status: DC | PRN
  Administered 2017-03-18: 60 ug/min via INTRAVENOUS

## 2017-03-18 MED ORDER — NALBUPHINE HCL 10 MG/ML IJ SOLN
INTRAMUSCULAR | Status: AC
Start: 2017-03-18 — End: 2017-03-19
  Filled 2017-03-18: qty 1

## 2017-03-18 MED ORDER — MORPHINE SULFATE (PF) 0.5 MG/ML IJ SOLN
INTRAMUSCULAR | Status: AC
  Filled 2017-03-18: qty 10

## 2017-03-18 MED ORDER — SIMETHICONE 80 MG PO CHEW
80.0000 mg | CHEWABLE_TABLET | Freq: Three times a day (TID) | ORAL | Status: DC
  Administered 2017-03-18 – 2017-03-20 (×3): 80 mg via ORAL
  Filled 2017-03-18 (×3): qty 1

## 2017-03-18 MED ORDER — COCONUT OIL OIL
1.0000 "application " | TOPICAL_OIL | Status: DC | PRN
  Administered 2017-03-20: 1 via TOPICAL
  Filled 2017-03-18: qty 120

## 2017-03-18 MED ORDER — IBUPROFEN 600 MG PO TABS
600.0000 mg | ORAL_TABLET | Freq: Four times a day (QID) | ORAL | Status: DC
  Administered 2017-03-18 – 2017-03-20 (×8): 600 mg via ORAL
  Filled 2017-03-18 (×8): qty 1

## 2017-03-18 MED ORDER — PROMETHAZINE HCL 25 MG/ML IJ SOLN: 6.2500 mg | INTRAMUSCULAR | Status: DC | PRN

## 2017-03-18 MED ORDER — TETANUS-DIPHTH-ACELL PERTUSSIS 5-2.5-18.5 LF-MCG/0.5 IM SUSP: 0.5000 mL | Freq: Once | INTRAMUSCULAR | Status: DC

## 2017-03-18 MED ORDER — CEFAZOLIN SODIUM-DEXTROSE 2-4 GM/100ML-% IV SOLN
INTRAVENOUS | Status: AC
  Filled 2017-03-18: qty 100

## 2017-03-18 MED ORDER — OXYTOCIN 40 UNITS IN LACTATED RINGERS INFUSION - SIMPLE MED: 2.5000 [IU]/h | INTRAVENOUS | Status: AC

## 2017-03-18 MED ORDER — LACTATED RINGERS IV SOLN
INTRAVENOUS | Status: DC | PRN
  Administered 2017-03-18: 14:00:00 via INTRAVENOUS

## 2017-03-18 MED ORDER — LACTATED RINGERS IV SOLN
Freq: Once | INTRAVENOUS | Status: AC
  Administered 2017-03-18: 11:00:00 via INTRAVENOUS

## 2017-03-18 MED ORDER — OXYCODONE HCL 5 MG PO TABS: 10.0000 mg | ORAL_TABLET | ORAL | Status: DC | PRN

## 2017-03-18 MED ORDER — OXYTOCIN 10 UNIT/ML IJ SOLN
INTRAMUSCULAR | Status: AC
  Filled 2017-03-18: qty 4

## 2017-03-18 MED ORDER — DIPHENHYDRAMINE HCL 25 MG PO CAPS
25.0000 mg | ORAL_CAPSULE | Freq: Four times a day (QID) | ORAL | Status: DC | PRN
  Administered 2017-03-18 – 2017-03-19 (×2): 25 mg via ORAL
  Filled 2017-03-18 (×3): qty 1

## 2017-03-18 MED ORDER — SIMETHICONE 80 MG PO CHEW: 80.0000 mg | CHEWABLE_TABLET | ORAL | Status: DC | PRN

## 2017-03-18 MED ORDER — LACTATED RINGERS IV SOLN
INTRAVENOUS | Status: DC | PRN
  Administered 2017-03-18 (×2): via INTRAVENOUS

## 2017-03-18 MED ORDER — MENTHOL 3 MG MT LOZG: 1.0000 | LOZENGE | OROMUCOSAL | Status: DC | PRN

## 2017-03-18 SURGICAL SUPPLY — 35 items
BARRIER ADHS 3X4 INTERCEED (GAUZE/BANDAGES/DRESSINGS) IMPLANT
BENZOIN TINCTURE PRP APPL 2/3 (GAUZE/BANDAGES/DRESSINGS) ×3 IMPLANT
CHLORAPREP W/TINT 26ML (MISCELLANEOUS) ×3 IMPLANT
CLAMP CORD UMBIL (MISCELLANEOUS) IMPLANT
CLOSURE WOUND 1/2 X4 (GAUZE/BANDAGES/DRESSINGS) ×1
CLOTH BEACON ORANGE TIMEOUT ST (SAFETY) ×3 IMPLANT
DRSG OPSITE POSTOP 4X10 (GAUZE/BANDAGES/DRESSINGS) ×3 IMPLANT
ELECT REM PT RETURN 9FT ADLT (ELECTROSURGICAL) ×3
ELECTRODE REM PT RTRN 9FT ADLT (ELECTROSURGICAL) ×1 IMPLANT
EXTRACTOR VACUUM KIWI (MISCELLANEOUS) IMPLANT
GLOVE BIO SURGEON STRL SZ 6.5 (GLOVE) ×2 IMPLANT
GLOVE BIO SURGEONS STRL SZ 6.5 (GLOVE) ×1
GLOVE BIOGEL PI IND STRL 7.0 (GLOVE) ×2 IMPLANT
GLOVE BIOGEL PI INDICATOR 7.0 (GLOVE) ×4
GOWN STRL REUS W/TWL LRG LVL3 (GOWN DISPOSABLE) ×6 IMPLANT
KIT ABG SYR 3ML LUER SLIP (SYRINGE) IMPLANT
NEEDLE HYPO 22GX1.5 SAFETY (NEEDLE) IMPLANT
NEEDLE HYPO 25X5/8 SAFETYGLIDE (NEEDLE) IMPLANT
NS IRRIG 1000ML POUR BTL (IV SOLUTION) ×3 IMPLANT
PACK C SECTION WH (CUSTOM PROCEDURE TRAY) ×3 IMPLANT
PAD OB MATERNITY 4.3X12.25 (PERSONAL CARE ITEMS) ×3 IMPLANT
PENCIL SMOKE EVAC W/HOLSTER (ELECTROSURGICAL) ×3 IMPLANT
RETAINER VISCERAL (MISCELLANEOUS) ×3 IMPLANT
RETRACTOR TRAXI PANNICULUS (MISCELLANEOUS) ×1 IMPLANT
RETRACTOR WND ALEXIS 25 LRG (MISCELLANEOUS) IMPLANT
RTRCTR WOUND ALEXIS 25CM LRG (MISCELLANEOUS)
STRIP CLOSURE SKIN 1/2X4 (GAUZE/BANDAGES/DRESSINGS) ×2 IMPLANT
SUT VIC AB 0 CT1 36 (SUTURE) ×18 IMPLANT
SUT VIC AB 2-0 CT1 27 (SUTURE) ×2
SUT VIC AB 2-0 CT1 TAPERPNT 27 (SUTURE) ×1 IMPLANT
SUT VIC AB 4-0 PS2 27 (SUTURE) ×3 IMPLANT
SYR CONTROL 10ML LL (SYRINGE) IMPLANT
TOWEL OR 17X24 6PK STRL BLUE (TOWEL DISPOSABLE) ×3 IMPLANT
TRAXI PANNICULUS RETRACTOR (MISCELLANEOUS) ×2
TRAY FOLEY BAG SILVER LF 14FR (SET/KITS/TRAYS/PACK) IMPLANT

## 2017-03-18 NOTE — H&P (Signed)
Obstetric Preoperative History and Physical  Cheryl Bauer is a 41 y.o. [id number] with IUP at [redacted]w[redacted]d presenting for scheduled repeat cesarean section. She has had 4 prior LTCS. No acute concerns.   Prenatal Course Source of Care: [facility] with onset of care at 15 weeks Pregnancy complications or risks: Patient Active Problem List   Diagnosis Date Noted  . Neonatal death in prior pregnancy, currently pregnant 10/15/2016  . History of cesarean delivery x 3, currently pregnant 10/01/2016  . Advanced maternal age in multigravida 09/29/2016  . Supervision of high-risk pregnancy 08/26/2016  . Conductive hearing loss of right ear with unrestricted hearing of left ear 11/04/2015  . Language barrier, speaks Arabic 05/31/2015   She plans to breastfeed She desires no method for postpartum contraception.   Prenatal labs and studies: ABO, Rh: --/--/A POS, A POS (08/22 1039) Antibody: NEG (08/22 1039) Rubella: 5.93 (01/31 1103) RPR: Non Reactive (08/22 1039)  HBsAg: NEGATIVE (01/31 1103)  HIV: NONREACTIVE (01/31 1103)  AVW:UJWJXBJY (07/31 1418) 2 hr Glucola  73/162/114 Genetic screening declined Anatomy US normal  Prenatal Transfer Tool  Maternal Diabetes: No Genetic Screening: Declined Maternal Ultrasounds/Referrals: Normal Fetal Ultrasounds or other Referrals:  None Maternal Substance Abuse:  No Significant Maternal Medications:  None Significant Maternal Lab Results: None  Past Medical History:  Diagnosis Date  . 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 Headache    not specified by pt. - occasional  . Hearing loss in right ear 11/2015  . 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 CESAREAN SECTION    . STAPEDECTOMY Right 12/04/2015   Procedure: RIGHT SIDE TYMPANOTOMY EXPLORATORY WITH STAPEDECTOMY;  Surgeon: Serena Colonel, MD;  Location: [facility];  Service: ENT;  Laterality: Right;    OB History  Gravida Para Term  Preterm AB Living  6 5 5     4   SAB TAB Ectopic Multiple Live Births          5    # Outcome Date GA Lbr Len/2nd Weight Sex Delivery Anes PTL Lv  6 Current           5 Term 07/27/08    M CS-LTranv   LIV  4 Term 10/28/06    F CS-LTranv   LIV  3 Term 07/27/02    F Vag-Spont   LIV  2 Term 08/11/99    M Vag-Spont   LIV  1 Term 07/27/97    F CS-LTranv   ND     Complications: Hypoxia of brain [facility])      Social History   Social History  . Marital status: Married    Spouse name: N/A  . Number of children: N/A  . Years of education: N/A   Social History Bauer Topics  . Smoking status: Former Smoker    Types: E-cigarettes  . Smokeless tobacco: Never Used     Comment: Hookah  . Alcohol use No  . Drug use: No  . Sexual activity: Yes    Birth control/ protection: None   Other Topics Concern  . None   Social History Narrative  . None    Family History  Problem Relation Age of Onset  . Cancer Father        kidney    Prescriptions Prior to Admission  Medication Sig Dispense Refill Last Dose  . acetaminophen (TYLENOL) 325 MG tablet Take 650 mg by mouth every 6 (six) hours as needed.  03/17/2017 at Unknown time  . Prenatal Vit-Fe Fumarate-FA (PRENATAL MULTIVITAMIN) TABS tablet Take 1 tablet by mouth daily at 12 noon.   03/17/2017 at Unknown time    No Known Allergies  Review of Systems: Negative except for what is mentioned in HPI.  Physical Exam: BP 120/80   Pulse 93   Temp (!) 97.5 F (36.4 C) (Oral)   Resp 18   LMP 05/20/2016 (Exact Date)   SpO2 100%  FHR by Doppler: 135 bpm CONSTITUTIONAL: Well-developed, well-nourished female in no acute distress.  HENT:  Normocephalic, atraumatic, External right and left ear normal. Oropharynx is clear and moist EYES: Conjunctivae and EOM are normal. Pupils are equal, round, and reactive to light. No scleral icterus.  NECK: Normal range of motion, supple, no masses SKIN: Skin is warm and dry. No rash noted. Not diaphoretic. No  erythema. No pallor. NEUROLGIC: Alert and oriented to person, place, and time. Normal reflexes, muscle tone coordination. No cranial nerve deficit noted. PSYCHIATRIC: Normal mood and affect. Normal behavior. Normal judgment and thought content. CARDIOVASCULAR: Normal heart rate noted, regular rhythm RESPIRATORY: Effort and breath sounds normal, no problems with respiration noted ABDOMEN: Soft, nontender, nondistended, gravid. Well-healed Pfannenstiel incision. PELVIC: Deferred MUSCULOSKELETAL: Normal range of motion. No edema and no tenderness. 2+ distal pulses.   Pertinent Labs/Studies:   Results for orders placed or performed during the hospital encounter of 03/17/17 (from the past 72 hour(s))  CBC     Status: Abnormal   Collection Time: 03/17/17 10:39 AM  Result Value Ref Range   WBC 9.0 4.0 - 10.5 K/uL   RBC 4.22 3.87 - 5.11 MIL/uL   Hemoglobin 11.2 (L) 12.0 - 15.0 g/dL   HCT 37.9 (L) 02.4 - 09.7 %   MCV 79.4 78.0 - 100.0 fL   MCH 26.5 26.0 - 34.0 pg   MCHC 33.4 30.0 - 36.0 g/dL   RDW 35.3 29.9 - 24.2 %   Platelets 246 150 - 400 K/uL  RPR     Status: None   Collection Time: 03/17/17 10:39 AM  Result Value Ref Range   RPR Ser Ql Non Reactive Non Reactive    Comment: (NOTE) Performed At: [facility] [address] [place], [place] [id number] Mila Homer MD WL:[id number]   Type and screen [facility]     Status: None   Collection Time: 03/17/17 10:39 AM  Result Value Ref Range   ABO/RH(D) A POS    Antibody Screen NEG    Sample Expiration 03/20/2017   ABO/Rh     Status: None   Collection Time: 03/17/17 10:39 AM  Result Value Ref Range   ABO/RH(D) A POS     Assessment and Plan :Cheryl Bauer is a 41 y.o. [id number] at [redacted]w[redacted]d being admitted for scheduled cesarean section. The risks of cesarean section discussed with the patient included but were not limited to: bleeding which may require transfusion or reoperation; infection which may require  antibiotics; injury to bowel, bladder, ureters or other surrounding organs; injury to the fetus; need for additional procedures including hysterectomy in the event of a life-threatening hemorrhage; placental abnormalities wth subsequent pregnancies, incisional problems, thromboembolic phenomenon and other postoperative/anesthesia complications. The patient concurred with the proposed plan, giving informed written consent for the procedure. Patient has been NPO since last night she will remain NPO for procedure. Anesthesia and OR aware. Preoperative prophylactic antibiotics and SCDs ordered on call to the OR. To OR when ready.   [name], DO  OB Fellow Faculty Practice, [facility] 03/18/2017, 12:33 PM Attestation of Attending Supervision of Obstetric Fellow: Evaluation and management procedures were performed by the Obstetric Fellow under my supervision and collaboration.  I have reviewed the Obstetric Fellow's note and chart, and I agree with the management and plan.  Scheryl Darter, MD, FACOG Attending Obstetrician & Gynecologist Faculty Practice, [facility]

## 2017-03-18 NOTE — Anesthesia Postprocedure Evaluation (Signed)
Anesthesia Post Note  Patient: Cheryl Bauer  Procedure(s) Performed: Procedure(s) (LRB): REPEAT CESAREAN SECTION (N/A)     Patient location during evaluation: PACU Anesthesia Type: Spinal Level of consciousness: oriented and awake and alert Pain management: pain level controlled Vital Signs Assessment: post-procedure vital signs reviewed and stable Respiratory status: spontaneous breathing and respiratory function stable Cardiovascular status: blood pressure returned to baseline and stable Postop Assessment: no headache and no backache Anesthetic complications: no    Last Vitals:  Vitals:   03/18/17 1500 03/18/17 1530  BP: 125/75 119/77  Pulse:  75  Resp: 20 16  Temp: (!) 36.3 C 36.7 C  SpO2:  98%    Last Pain:  Vitals:   03/18/17 1543  TempSrc:   PainSc: 6                  Lowella Curb

## 2017-03-18 NOTE — Anesthesia Preprocedure Evaluation (Signed)
Anesthesia Evaluation  Patient identified by MRN, date of birth, ID band Patient awake    Reviewed: Allergy & Precautions, NPO status , Patient's Chart, lab work & pertinent test results  Airway Mallampati: II  TM Distance: >3 FB Neck ROM: Full    Dental no notable dental hx.    Pulmonary neg pulmonary ROS, former smoker,    Pulmonary exam normal breath sounds clear to auscultation       Cardiovascular negative cardio ROS Normal cardiovascular exam Rhythm:Regular Rate:Normal     Neuro/Psych  Headaches, negative psych ROS   GI/Hepatic negative GI ROS, Neg liver ROS,   Endo/Other  negative endocrine ROS  Renal/GU negative Renal ROS     Musculoskeletal negative musculoskeletal ROS (+)   Abdominal   Peds  Hematology negative hematology ROS (+)   Anesthesia Other Findings   Reproductive/Obstetrics negative OB ROS (+) Pregnancy                             Anesthesia Physical  Anesthesia Plan  ASA: II  Anesthesia Plan: Spinal   Post-op Pain Management:    Induction:   PONV Risk Score and Plan: 2 and Ondansetron and Treatment may vary due to age or medical condition  Airway Management Planned: Natural Airway  Additional Equipment:   Intra-op Plan:   Post-operative Plan:   Informed Consent: I have reviewed the patients History and Physical, chart, labs and discussed the procedure including the risks, benefits and alternatives for the proposed anesthesia with the patient or authorized representative who has indicated his/her understanding and acceptance.   Dental advisory given  Plan Discussed with: CRNA  Anesthesia Plan Comments:         Anesthesia Quick Evaluation

## 2017-03-18 NOTE — Anesthesia Procedure Notes (Signed)
Spinal  Patient location during procedure: OB Start time: 03/18/2017 12:56 PM End time: 03/18/2017 1:01 PM Staffing Anesthesiologist: Anitra Lauth RAY Performed: anesthesiologist  Preanesthetic Checklist Completed: patient identified, surgical consent, pre-op evaluation, timeout performed, IV checked, risks and benefits discussed and monitors and equipment checked Spinal Block Patient position: sitting Prep: site prepped and draped and DuraPrep Patient monitoring: heart rate, cardiac monitor, continuous pulse ox and blood pressure Approach: midline Location: L3-4 Injection technique: single-shot Needle Needle type: Pencan  Needle gauge: 24 G Needle length: 10 cm Assessment Sensory level: T4 Additional Notes Spinal performed by Ardyth Man

## 2017-03-18 NOTE — Progress Notes (Signed)
Pt called out feeling a gush of blood. RN with assistance of another RN went into room to assess situation. Pt had small trickle of blood when fundus was assessed, but fundus remain firm, trickle stopping quickly. 3 small stringy clots found on pad when changed, no more out when fundus pushed on. Will recheck fundus and bleeding in an hour.

## 2017-03-18 NOTE — Transfer of Care (Signed)
Immediate Anesthesia Transfer of Care Note  Patient: Cheryl Bauer  Procedure(s) Performed: Procedure(s): REPEAT CESAREAN SECTION (N/A)  Patient Location: PACU  Anesthesia Type:Spinal  Level of Consciousness: awake, alert  and oriented  Airway & Oxygen Therapy: Patient Spontanous Breathing  Post-op Assessment: Report given to RN and Post -op Vital signs reviewed and stable  Post vital signs: Reviewed and stable BP 125/75, HR 88, RR 16, SaO2 98%  Last Vitals:  Vitals:   03/18/17 0957  BP: 120/80  Pulse: 93  Resp: 18  Temp: (!) 36.4 C  SpO2: 100%    Last Pain:  Vitals:   03/18/17 0957  TempSrc: Oral      Patients Stated Pain Goal: 3 (03/18/17 0957)  Complications: No apparent anesthesia complications

## 2017-03-18 NOTE — Op Note (Signed)
Cheryl Bauer PROCEDURE DATE: 03/18/2017  PREOPERATIVE DIAGNOSES: Intrauterine pregnancy at [redacted]w[redacted]d weeks gestation; prior cesarean x3  POSTOPERATIVE DIAGNOSES: The same  PROCEDURE: Repeat Low Transverse Cesarean Section  SURGEON:  Dr. Scheryl Darter and Dr. [name]  ASSISTANT:  None  ANESTHESIOLOGY TEAM: Anesthesiologist: Lowella Curb, MD CRNA: Collier Salina, Eulis Manly., CRNA  INDICATIONS: Cheryl Bauer is a 41 y.o. [phone] at [redacted]w[redacted]d here for cesarean section secondary to the indications listed under preoperative diagnoses; please see preoperative note for further details.  The risks of cesarean section were discussed with the patient including but were not limited to: bleeding which may require transfusion or reoperation; infection which may require antibiotics; injury to bowel, bladder, ureters or other surrounding organs; injury to the fetus; need for additional procedures including hysterectomy in the event of a life-threatening hemorrhage; placental abnormalities wth subsequent pregnancies, incisional problems, thromboembolic phenomenon and other postoperative/anesthesia complications.   The patient concurred with the proposed plan, giving informed written consent for the procedure.    FINDINGS:  Viable female infant in cephalic presentation.  Apgars 7 and 8.  Clear amniotic fluid.  Intact placenta, three vessel cord.  Normal uterus, fallopian tubes and ovaries bilaterally. Moderate adhesions.  ANESTHESIA: Spinal  INTRAVENOUS FLUIDS: 2000 ml   ESTIMATED BLOOD LOSS: 758 ml URINE OUTPUT:  700 ml SPECIMENS: Placenta sent to L&D COMPLICATIONS: None immediate  PROCEDURE IN DETAIL:  The patient preoperatively received intravenous antibiotics and had sequential compression devices applied to her lower extremities.  She was then taken to the operating room where spinal anesthesia was administered and was found to be adequate. She was then placed in a dorsal supine position with a  leftward tilt, and prepped and draped in a sterile manner.  A foley catheter was placed into her bladder and attached to constant gravity.  After an adequate timeout was performed, a Pfannenstiel skin incision was made with scalpel over her preexisting scar and carried through to the underlying layer of fascia. The fascia was incised in the midline, and this incision was extended bilaterally using the Mayo scissors.  Kocher clamps were applied to the superior aspect of the fascial incision and the underlying rectus muscles were dissected off bluntly.  A similar process was carried out on the inferior aspect of the fascial incision. The rectus muscles were separated in the midline sharply due to adhesions and the peritoneum was entered sharply. A bladder flap was then created. Attention was turned to the lower uterine segment where a low transverse hysterotomy was made with a scalpel and extended bilaterally bluntly.  The infant was successfully delivered, the cord was clamped and cut after one minute, and the infant was handed over to the awaiting neonatology team. Uterine massage was then administered, and the placenta delivered intact with a three-vessel cord. The uterus was then cleared of clots and debris.  The hysterotomy was closed with 0 Vicryl in a running locked fashion, and an imbricating layer was also placed with 0 Vicryl.  The pelvis was cleared of all clot and debris. Hemostasis was confirmed on all surfaces.  The peritoneum and the rectus muscles were reapproximated using 0 Vicryl interrupted stitches. The fascia was then closed using 0 Vicryl in a running fashion.  The subcutaneous layer was irrigated, and 30 ml of 0.5% Marcaine was injected subcutaneously around the incision.  The skin was closed with a 4-0 Vicryl subcuticular stitch. The patient tolerated the procedure well. Sponge, lap, instrument and needle counts were correct x  3.  She was taken to the recovery room in stable condition.    [name], DO OB Fellow Faculty Practice, [facility] 03/18/2017, 2:48 PM

## 2017-03-19 ENCOUNTER — Encounter (HOSPITAL_COMMUNITY): Payer: Self-pay | Admitting: Obstetrics & Gynecology

## 2017-03-19 LAB — CBC
HCT: 27 % — ABNORMAL LOW (ref 36.0–46.0)
HEMOGLOBIN: 9.3 g/dL — AB (ref 12.0–15.0)
MCH: 27.4 pg (ref 26.0–34.0)
MCHC: 34.4 g/dL (ref 30.0–36.0)
MCV: 79.4 fL (ref 78.0–100.0)
Platelets: 219 10*3/uL (ref 150–400)
RBC: 3.4 MIL/uL — ABNORMAL LOW (ref 3.87–5.11)
RDW: 14.6 % (ref 11.5–15.5)
WBC: 11.3 10*3/uL — ABNORMAL HIGH (ref 4.0–10.5)

## 2017-03-19 NOTE — Anesthesia Postprocedure Evaluation (Signed)
Anesthesia Post Note  Patient: Cheryl Bauer  Procedure(s) Performed: Procedure(s) (LRB): REPEAT CESAREAN SECTION (N/A)     Patient location during evaluation: Mother Baby Anesthesia Type: Spinal Level of consciousness: awake and alert and oriented Pain management: pain level controlled Vital Signs Assessment: post-procedure vital signs reviewed and stable Respiratory status: spontaneous breathing and nonlabored ventilation Cardiovascular status: stable Postop Assessment: no headache, patient able to bend at knees, no backache, no signs of nausea or vomiting, spinal receding and adequate PO intake Anesthetic complications: no    Last Vitals:  Vitals:   03/19/17 0820 03/19/17 1315  BP: (!) 107/57 106/74  Pulse: 77 88  Resp: 18 18  Temp: 37 C 37.1 C  SpO2:  98%    Last Pain:  Vitals:   03/19/17 1315  TempSrc: Oral  PainSc: 7    Pain Goal: Patients Stated Pain Goal: 3 (03/19/17 1315)               Alexandrya Chim Hristova

## 2017-03-19 NOTE — Progress Notes (Signed)
POSTPARTUM PROGRESS NOTE  POD #1 for rTLCS  Subjective:  Cheryl Bauer is a 41 y.o. [id number] s/p rTLCS at [redacted]w[redacted]d.  No acute events overnight.  Pt denies problems with ambulating, voiding or po intake.  She denies nausea or vomiting.  Pain is well controlled.  She has had flatus. She has not had bowel movement.  Lochia Moderate. She reported significant itching without rash, received benadryl x1.   Objective: Blood pressure (!) 108/57, pulse 83, temperature 98.6 F (37 C), temperature source Oral, resp. rate 18, last menstrual period 05/20/2016, SpO2 98 %, unknown if currently breastfeeding.  Physical Exam:  General: alert, cooperative and no distress Chest: no respiratory distress Heart:regular rate, distal pulses intact Abdomen: soft, nontender,  Uterine Fundus: firm, appropriately tender DVT Evaluation: No calf swelling or tenderness Extremities: no edema Skin: warm, dry; incision intact, dried blood visible on the R border of the incision, not actively bleeding   Recent Labs  03/17/17 1039 03/19/17 0500  HGB 11.2* 9.3*  HCT 33.5* 27.0*    Assessment/Plan: Cheryl Bauer is a 41 y.o. [id number] s/p rTLCS at [redacted]w[redacted]d.   POD#1 - Doing well Contraception: none Feeding: breast Dispo: Plan for discharge tomorrow or Sunday   LOS: 1 day   Dannette Barbara, Medical Student 03/19/2017, 7:27 AM   I have seen and examined this patient and agree with the management plan.

## 2017-03-19 NOTE — Progress Notes (Signed)
RN went in room with CRNA and Midwife to do assessments and use the interpreter.  We were unable to get the video interpreter to work for more than a few minutes and when using the language line phone the interpreter also stopped working after a few minutes.  This RN was unable to get the interpreter to work.  Patient had a friend in the room who was able to ask the patient general questions such as pain for [place].  No education completed without the interpreter.  RN will try language line later in the day.

## 2017-03-19 NOTE — Lactation Note (Addendum)
This note was copied from a baby's chart. Lactation Consultation Note:  Mother speaks Arabic. She understands some english. Oldest son  at the bedside to interpret . Mother reports that she breastfed her 4 other children for 1 1/2 yrs. Mothers youngest child is 39 yrs old. Mother breastfeeding infant when I arrived in the room. Infant in football hold. Mother pulling areola tissue away from infants nose. Assist with repositioning infant with deeper latch and more pillow support.  Reviewed breastfeeding basics. Mother advised to feed infant 8-12 times in 24 hours and with all feeding cues. Mother to do frequent skin to skin to rouse infant for good feedings.  Mother taught hand expression and observed drops of colostrum. Father questioned what to do if the nipples get dry and crack. Advised to hand express frequently and use milk to apply to nipples and allow to air dry. Mother also informed of available coconut oil if needed. Mother was given [facility] brochure and informed of all available LC services. Discussed Parents receptive to all teaching.   Patient Name: Cheryl Bauer [id number] Date: 03/19/2017 Reason for consult: Initial assessment   Maternal Data Has patient been taught Hand Expression?: Yes Does the patient have breastfeeding experience prior to this delivery?: Yes  Feeding Feeding Type: Breast Fed Length of feed: 25 min  LATCH Score Latch: Grasps breast easily, tongue down, lips flanged, rhythmical sucking.  Audible Swallowing: A few with stimulation  Type of Nipple: Everted at rest and after stimulation  Comfort (Breast/Nipple): Soft / non-tender  Hold (Positioning): Assistance needed to correctly position infant at breast and maintain latch.  LATCH Score: 8  Interventions Interventions: Breast feeding basics reviewed;Assisted with latch;Skin to skin;Breast massage;Hand express;Breast compression;Adjust position;Support pillows  Lactation Tools Discussed/Used     Consult  Status Consult Status: Follow-up Date: 03/19/17 Follow-up type: In-patient    Stevan Born [facility] 03/19/2017, 12:08 PM

## 2017-03-19 NOTE — Addendum Note (Signed)
Addendum  created 03/19/17 1740 by Elgie Congo, CRNA   Sign clinical note

## 2017-03-20 NOTE — Discharge Summary (Signed)
OB Discharge Summary     Patient Name: Cheryl Bauer DOB: October 31, 1975 MRN: [id number]  Date of admission: 2017/04/13 Delivering MD: Adam Phenix   Date of discharge: 03/20/2017  Admitting diagnosis: RCS Intrauterine pregnancy: [redacted]w[redacted]d     Secondary diagnosis:  Active Problems:   Status post repeat low transverse cesarean section  Additional problems:  Patient Active Problem List   Diagnosis Date Noted  . Status post repeat low transverse cesarean section 13-Apr-2017  . Neonatal death in prior pregnancy, currently pregnant 10/15/2016  . History of cesarean delivery x 3, currently pregnant 10/01/2016  . Advanced maternal age in multigravida 09/29/2016  . Supervision of high-risk pregnancy 08/26/2016  . Conductive hearing loss of right ear with unrestricted hearing of left ear 11/04/2015  . Language barrier, speaks Arabic 05/31/2015        Discharge diagnosis: Term Pregnancy Delivered                                                                                                Post partum procedures:none  Augmentation: AROM  Complications: None  Hospital course:  Sceduled C/S   41 y.o. yo [id number] at [redacted]w[redacted]d was admitted to the hospital 04-13-2017 for scheduled cesarean section with the following indication:Elective Repeat.  Membrane Rupture Time/Date: 1:46 PM ,04/13/2017   Patient delivered a Viable infant.04/13/17  Details of operation can be found in separate operative note.  Pateint had an uncomplicated postpartum course.  She is ambulating, tolerating a regular diet, passing flatus, and urinating well. Patient is discharged home in stable condition on  03/20/17         Physical exam  Vitals:   03/19/17 0820 03/19/17 1315 03/19/17 1857 03/20/17 0505  BP: (!) 107/57 106/74 119/67 116/69  Pulse: 77 88 93 73  Resp: 18 18 18 18   Temp: 98.6 F (37 C) 98.7 F (37.1 C) 99.1 F (37.3 C) 97.7 F (36.5 C)  TempSrc: Oral Oral Oral Oral  SpO2:  98%  98%   General: alert,  cooperative and no distress Lochia: appropriate Uterine Fundus: firm Incision: Healing well with no significant drainage DVT Evaluation: No evidence of DVT seen on physical exam. Labs: Lab Results  Component Value Date   WBC 11.3 (H) 03/19/2017   HGB 9.3 (L) 03/19/2017   HCT 27.0 (L) 03/19/2017   MCV 79.4 03/19/2017   PLT 219 03/19/2017   CMP Latest Ref Rng & Units 10/01/2016  Glucose 65 - 99 mg/dL 74  BUN 6 - 20 mg/dL 6  Creatinine 7.84 - 6.96 mg/dL 2.95(M)  Sodium 841 - 324 mmol/L 136  Potassium 3.5 - 5.2 mmol/L 3.9  Chloride 96 - 106 mmol/L 97  CO2 18 - 29 mmol/L 22  Calcium 8.7 - 10.2 mg/dL 9.8  Total Protein 6.0 - 8.5 g/dL 6.9  Total Bilirubin 0.0 - 1.2 mg/dL <4.0  Alkaline Phos 39 - 117 IU/L 61  AST 0 - 40 IU/L 16  ALT 0 - 32 IU/L 11    Discharge instruction: per After Visit Summary and "Baby and Me Booklet".  After visit meds:  Allergies  as of 03/20/2017   No Known Allergies     Medication List    TAKE these medications   prenatal multivitamin Tabs tablet Take 1 tablet by mouth daily at 12 noon.   TYLENOL 325 MG tablet Generic drug:  acetaminophen Take 650 mg by mouth every 6 (six) hours as needed.       Diet: routine diet  Activity: Advance as tolerated. Pelvic rest for 6 weeks.   Outpatient follow up:6 weeks Follow up Appt: Future Appointments Date Time Provider Department Center  04/12/2017 2:40 PM Marny Lowenstein, PA-C [facility]   Follow up Visit:No Follow-up on file.  Postpartum contraception: None  Newborn Data: Live born female  Birth Weight: 5 lb 14.2 oz (2670 g) APGAR: 7, 8  Baby Feeding: Breast Disposition:home with mother   03/20/2017 Marylene Land, CNM

## 2017-04-12 ENCOUNTER — Encounter: Payer: Self-pay | Admitting: Medical

## 2017-04-12 ENCOUNTER — Ambulatory Visit (INDEPENDENT_AMBULATORY_CARE_PROVIDER_SITE_OTHER): Payer: Medicaid Other | Admitting: Medical

## 2017-04-12 DIAGNOSIS — Z1389 Encounter for screening for other disorder: Secondary | ICD-10-CM | POA: Diagnosis not present

## 2017-04-12 DIAGNOSIS — Z98891 History of uterine scar from previous surgery: Secondary | ICD-10-CM

## 2017-04-12 DIAGNOSIS — Z23 Encounter for immunization: Secondary | ICD-10-CM | POA: Diagnosis not present

## 2017-04-12 DIAGNOSIS — Z8759 Personal history of other complications of pregnancy, childbirth and the puerperium: Secondary | ICD-10-CM | POA: Insufficient documentation

## 2017-04-12 NOTE — Progress Notes (Signed)
Subjective:     Cheryl Bauer is a 41 y.o. female who presents for a postpartum visit. She is 4 weeks postpartum following a low cervical transverse Cesarean section. I have fully reviewed the prenatal and intrapartum course. The delivery was at 39 gestational weeks. Outcome: repeat cesarean section, low transverse incision. Anesthesia: spinal. Postpartum course has been normal. Baby's course has been normal. Baby is feeding by breast. Bleeding staining only. Bowel function is normal. Bladder function is normal. Patient is not sexually active. Contraception method is none. Postpartum depression screening: negative.  The following portions of the patient's history were reviewed and updated as appropriate: allergies, current medications, past family history, past medical history, past social history, past surgical history and problem list.  Review of Systems Pertinent items are noted in HPI.   Objective:    BP 112/61   Pulse 83   Wt 180 lb 12.8 oz (82 kg)   LMP  (LMP Unknown)   Breastfeeding? Yes   BMI 31.03 kg/m   General:  alert and cooperative   Breasts:  negative  Lungs: clear to auscultation bilaterally  Heart:  regular rate and rhythm, S1, S2 normal, no murmur, click, rub or gallop  Abdomen: soft, non-tender; bowel sounds normal; no masses,  no organomegaly incision is healing well   Vulva:  not evaluated  Vagina: not evaluated  Cervix:  not evaluated  Corpus: not examined  Adnexa:  not evaluated  Rectal Exam: Not performed.        Assessment:     Normal postpartum exam. Pap smear not done at today's visit.   Plan:    1. Contraception: NFP 2. Follow up in: 1 year for annual exam or sooner as needed.    Marny Lowenstein, PA-C 04/12/2017 2:23 PM

## 2017-04-12 NOTE — Patient Instructions (Signed)
Natural Family Planning Introduction Natural Family Planning (NFP) is a type of birth control without using any form of contraception. Women who use NFP should not have sexual intercourse when the ovary produces an egg (ovulation) during the menstrual cycle. The NFP method is safe and can prevent pregnancy. It is 75% effective when practiced right. The man needs to also understand this method of birth control and the woman needs to be aware of how her body functions during her menstrual cycle. NFP can also be used as a method of getting pregnant. HOW THE NFP METHOD WORKS  A woman's menstrual period usually happens every 28-30 days (it can vary from 23-35 days).  Ovulation happens 12-14 days before the start of the next menstrual period (the fertile period). The egg is fertile for 24 hours and the sperm can live for 3 days or more. If there is sexual intercourse at this time, pregnancy can occur. THERE ARE MANY TYPES OF NFP METHODS USED TO PREVENT PREGNANCY  The basal body temperature method. Often times, there is a slight increase of body temperature when a woman ovulates. Take your temperature every morning before getting out of bed. Write the temperature on a chart. An increase in the temperature shows ovulation has happened. Do not have sexual intercourse from the menstrual period up to three days after the increase in the temperature. Note that the body temperature may increase as a result of fever, restless sleep, and working schedules.  The ovulation cervical mucus method. During the menstrual cycle, the cervical mucus changes from dry and sticky to wet and slippery. Check the mucus of the vagina every day to look for these changes. Just before ovulation, the mucus becomes wet and slippery. On the last day of wetness, ovulation happens. To avoid getting pregnant, sexual intercourse is safe for about 10 days after the menstrual period and on the dry mucus days. Do not have sexual intercourse when  the mucus starts to show up and not until 4 days after the wet and slippery mucus goes away. Sexual intercourse after the 4 days have passed until the menstrual period starts is a safe time. Note that the mucus from the vagina can increase because of a vaginal or cervical infection, lubricants, some medicines, and sexual excitement.  The symptothermal method. This method uses both the temperature and the ovulation methods. Combine the two methods above to prevent pregnancy.  The calendar method. Record your menstrual periods and length of the cycles for 6 months. This is helpful when the menstrual cycle varies in the length of the cycle. The length of a menstrual cycle is from day 1 of the present menstrual period to day 1 of the next menstrual period. Then, find your fertile days of the month and do not have sexual intercourse during that time. You may need help from your health care provider to find out your fertile days. There are some signs of ovulation that may be helpful when trying to find the time of ovulation. This includes vaginal spotting or abdominal cramps during the middle of your menstrual cycle. Not all women have these symptoms. YOU SHOULD NOT USE NFP IF:  You have very irregular menstrual periods and may skip months.  You have abnormal bleeding.  You have a vaginal or cervical infection.  You are on medicines that can affect the vaginal mucus or body temperature. These medicines include antibiotics, thyroid medicines, and antihistamines (cold and allergy medicine). This information is not intended to replace advice given   to you by your health care provider. Make sure you discuss any questions you have with your health care provider. Document Released: 12/30/2007 Document Revised: 12/19/2015 Document Reviewed: 01/13/2013 Elsevier Interactive Patient Education  2017 Elsevier Inc.  

## 2017-04-19 ENCOUNTER — Ambulatory Visit (INDEPENDENT_AMBULATORY_CARE_PROVIDER_SITE_OTHER): Payer: Medicaid Other | Admitting: Family Medicine

## 2017-04-19 ENCOUNTER — Encounter: Payer: Self-pay | Admitting: Family Medicine

## 2017-04-19 VITALS — BP 122/84 | HR 96 | Temp 98.2°F | Ht 64.0 in | Wt 179.8 lb

## 2017-04-19 DIAGNOSIS — G5602 Carpal tunnel syndrome, left upper limb: Secondary | ICD-10-CM | POA: Diagnosis not present

## 2017-04-19 DIAGNOSIS — S161XXD Strain of muscle, fascia and tendon at neck level, subsequent encounter: Secondary | ICD-10-CM | POA: Insufficient documentation

## 2017-04-19 HISTORY — DX: Strain of muscle, fascia and tendon at neck level, subsequent encounter: S16.1XXD

## 2017-04-19 MED ORDER — WRIST SPLINT/COCK-UP/LEFT L MISC: 1.0000 | 0 refills | Status: AC

## 2017-04-19 NOTE — Progress Notes (Signed)
    Subjective:  Cheryl Bauer is a 41 y.o. female who presents to the [facility] today with a chief complaint of neck pain. History taken via Arabic video interpreter  HPI:  Neck pain - In her upper L neck and radiates to her upper back and back of her head, sometimes to side of her neck as well. Dull stretching sensation. - has had in the past and relieved with flexeril - has had some relief with OTC tylenol and ibuprofen - has h/o "congenital shoulder dislocation" - no falls/injury/trauma or weakness or dropping things - has been doing some more lifting lately while caring for her newborn baby - does state that she pays very close attention to this and any other symptoms as we is very anxious and afraid of developing cancer d/t her family history. Her father was a smoker who developed lung cancer. She has never smoked or used any other tobacco products in her life.  L hand numbness - Has had L hand numbness and tingling since she gave birth, up to her wrist  - usually in the first 3 fingers of her L hand and sometimes her thumb - Associated with overuse, she is R handed but does most of her household chores with her L hand - no arm numbness/tingling/pain  Patient would like to discuss "thyroid issues" at next visit  ROS: Per HPI  Objective:  Physical Exam: BP 122/84   Pulse 96   Temp 98.2 F (36.8 C) (Oral)   Ht  (1.626 m)   Wt 179 lb 12.8 oz (81.6 kg)   LMP  (LMP Unknown)   SpO2 98%   BMI 30.86 kg/m   Gen: NAD, resting comfortably HEENT: [place], AT. TM's pearly with good light reflex. Oropharynx nonerythematous Neck: supple, no lymphadenopathy CV: RRR with no murmurs appreciated Pulm: NWOB, CTAB with no crackles, wheezes, or rhonchi GI: Normal bowel sounds present. Soft, Nontender, Nondistended. MSK: no deformities of neck and back. 5/5 strength in UE. Neg Spurling. TTP over L SCM and L trapezius muscles. No bony tenderness. Positive Tinel and reverse Phalen and left  hand. Skin: warm, dry Neuro: grossly normal, moves all extremities Psych: appears anxious and was tearful while discussing her father's cancer   Assessment/Plan:  Neck muscle strain, subsequent encounter Neck pain appears to be MSK in nature given tenderness over her sternocleidomastoid and trapezius on the left side. No red flags on history or on exam today. Patient herself states that she is very anxious and tends to pay very close attention to any somatic symptoms. Advised conservative management with over-the-counter Tylenol and ibuprofen at home, heating pad, massage, stretches. Discussed trial of Flexeril however patient declined given that she was breast-feeding. Discussed return precautions with patient and her daughter.  Carpal tunnel syndrome of left wrist Patient has tingling in her left hand associated with overuse in the distribution of median nerve consistent with carpal tunnel. Has good strength and sensation in her hand today. Given prescription for cockup wrist splint to use nightly.  Follow-up in 2 weeks to see if her MSK complaints have improved and to discuss "thyroid issues"  Leland Her, DO PGY-2, [facility] 04/19/2017 2:56 PM

## 2017-04-19 NOTE — Assessment & Plan Note (Signed)
Patient has tingling in her left hand associated with overuse in the distribution of median nerve consistent with carpal tunnel. Has good strength and sensation in her hand today. Given prescription for cockup wrist splint to use nightly.

## 2017-04-19 NOTE — Assessment & Plan Note (Signed)
Neck pain appears to be MSK in nature given tenderness over her sternocleidomastoid and trapezius on the left side. No red flags on history or on exam today. Patient herself states that she is very anxious and tends to pay very close attention to any somatic symptoms. Advised conservative management with over-the-counter Tylenol and ibuprofen at home, heating pad, massage, stretches. Discussed trial of Flexeril however patient declined given that she was breast-feeding. Discussed return precautions with patient and her daughter.

## 2017-04-19 NOTE — Patient Instructions (Addendum)
It was good to see you today!  For your neck pain from muscle strain, - Please take OTC tylenol and ibuprofen for this. Try heating pads, massage, stretches. We decided that we would try this before retrial-ing flexeril.   For your carpal tunnel, - Wear you wirst splint for 8 hours at night while you sleep. You can pick this up at [facility] [address] [place], [place], [place] [address] [phone]   Please check-out at the front desk before leaving the clinic. Make an appointment in  2 weeks to follow up on neck pain and to discuss your thyroid.  Please bring all of your medications with you to each visit.   Sign up for My Chart to have easy access to your labs results, and communication with your primary care physician.  Feel free to call with any questions or concerns at any time, at [phone].   Take care,  Dr. Leland Her, DO [facility]

## 2017-05-06 ENCOUNTER — Ambulatory Visit: Payer: Medicaid Other | Admitting: Family Medicine

## 2017-09-30 ENCOUNTER — Ambulatory Visit: Payer: Medicaid Other | Admitting: Physical Therapy

## 2017-10-11 ENCOUNTER — Ambulatory Visit: Payer: Medicaid Other | Attending: Nurse Practitioner | Admitting: Physical Therapy

## 2017-10-11 ENCOUNTER — Other Ambulatory Visit: Payer: Self-pay

## 2017-10-11 DIAGNOSIS — M542 Cervicalgia: Secondary | ICD-10-CM | POA: Insufficient documentation

## 2017-10-11 DIAGNOSIS — M79602 Pain in left arm: Secondary | ICD-10-CM | POA: Diagnosis present

## 2017-10-11 DIAGNOSIS — M25511 Pain in right shoulder: Secondary | ICD-10-CM | POA: Diagnosis present

## 2017-10-11 DIAGNOSIS — G8929 Other chronic pain: Secondary | ICD-10-CM | POA: Diagnosis present

## 2017-10-11 DIAGNOSIS — M25512 Pain in left shoulder: Secondary | ICD-10-CM | POA: Diagnosis present

## 2017-10-11 NOTE — Therapy (Signed)
[facility] [address] [place], [place], [address] Phone: [phone]   Fax:  [phone]  Physical Therapy Evaluation  Patient Details  Name: Cheryl Bauer MRN: [id number] Date of Birth: Nov 07, 1975 Referring Provider: Maudie Flakes, FNP   Encounter Date: 10/11/2017  PT End of Session - 10/11/17 0914    Visit Number  1    Number of Visits  4    Date for PT Re-Evaluation  11/12/17    Authorization Type  MCD- waiting for auth    PT Start Time  0905    PT Stop Time  0944    PT Time Calculation (min)  39 min    Activity Tolerance  Patient tolerated treatment well    Behavior During Therapy  [facility] for tasks assessed/performed       Past Medical History:  Diagnosis Date  . 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 Headache    not specified by pt. - occasional  . Hearing loss in right ear 11/2015  . 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 CESAREAN SECTION    . CESAREAN SECTION N/A 03/18/2017   Procedure: REPEAT CESAREAN SECTION;  Surgeon: Adam Phenix, MD;  Location: [facility];  Service: Obstetrics;  Laterality: N/A;  . STAPEDECTOMY Right 12/04/2015   Procedure: RIGHT SIDE TYMPANOTOMY EXPLORATORY WITH STAPEDECTOMY;  Surgeon: Serena Colonel, MD;  Location: [facility];  Service: ENT;  Laterality: Right;    There were no vitals filed for this visit.   Subjective Assessment - 10/11/17 0909    Subjective  About one year ago fained when walking in park, her daughter said that she complained of numb hand. Left sided neck pain with raising arm, pain to middle three fingers comes and goes. Front side of right shoulder rarely hurts.     Currently in Pain?  Yes    Pain Score  7     Pain Location  Shoulder    Pain Orientation  Left;Anterior    Pain Descriptors / Indicators  -- pain to palpation    Aggravating Factors   housework    Pain Relieving Factors   tylenol         [facility] PT Assessment - 10/11/17 0001      Assessment   Medical Diagnosis  chronic neck pain    Referring Provider  Maudie Flakes, FNP    Onset Date/Surgical Date  -- about 1 year      Precautions   Precautions  None      Restrictions   Weight Bearing Restrictions  No      Balance Screen   Has the patient fallen in the past 6 months  No      Prior Function   Level of Independence  Independent      Cognition   Overall Cognitive Status  Within Functional Limits for tasks assessed      Sensation   Additional Comments  occasional pain to Lt digits 2-4      Posture/Postural Control   Posture Comments  rounded shoulders with elevation      ROM / Strength   AROM / PROM / Strength  AROM      AROM   Overall AROM Comments  WFL, Lt sided pain with bil rotation and Rt sidebend    AROM Assessment Site  Cervical      Palpation   Palpation comment  tightness and concordant pain in Lt scalenes and upper trap             Objective measurements completed on examination: See above findings.      [facility] Adult PT Treatment/Exercise - 10/11/17 0001      Exercises   Exercises  Neck      Neck Exercises: Seated   Other Seated Exercise  scapular retraction      Modalities   Modalities  Moist Heat      Moist Heat Therapy   Number Minutes Moist Heat  10 Minutes    Moist Heat Location  Cervical      Neck Exercises: Stretches   Upper Trapezius Stretch  Left;30 seconds    Other Neck Stretches  lt scalene stretch             PT Education - 10/11/17 0942    Education provided  Yes    Education Details  anatomy of condition, POC, HEP, exercise form/rationale, rest break with 10 min heat every hour of housework    Person(s) Educated  Patient    Methods  Explanation;Demonstration;Tactile cues;Verbal cues;Handout    Comprehension  Verbalized understanding;Need further instruction;Returned demonstration;Verbal cues required;Tactile cues required        PT Short Term Goals - 10/11/17 1026      PT SHORT TERM GOAL #1   Title  Pt will be able to incoorporate HEP into her day for management of pain    Baseline  began educating at eval    Time  4    Period  Weeks    Status  New    Target Date  11/12/17 to accomodate for MCD auth period      PT SHORT TERM GOAL #2   Title  Pt will be able to find a comfortable sleeping position without severe neck pain    Baseline  severe when laying down on Lt side at eval    Time  4    Period  Weeks    Status  New    Target Date  11/12/17        PT Long Term Goals - 10/11/17 1029      PT LONG TERM GOAL #1   Title  Average pain <=3/10 with daily activities    Baseline  severe at eval, 7/10 at rest    Time  8    Period  Weeks    Status  New    Target Date  12/10/17 to accomodate for MCD auth period      PT LONG TERM GOAL #2   Title  Pt will be able to reach overhead without increase in pain    Baseline  significant increase in pain at eval    Time  8    Period  Weeks    Status  New    Target Date  12/10/17      PT LONG TERM GOAL #3   Title  gross UE MMT 5/5 without increase in pain    Baseline  able to hold with significant increase in pain with all tests at eval    Time  8    Period  Weeks    Status  New    Target Date  12/10/17      PT LONG TERM GOAL #4   Title  Pt will be independent in long term HEP to continue strengthening and managing postural alignment through daily activities    Baseline  will establish and progress  as appropriate    Time  8    Period  Weeks    Status  New    Target Date  12/10/17             Plan - 10/11/17 0943    Clinical Impression Statement  Pt presents to PT with complaints of left-sided neck pain with pain referring distally to digits 2-4 as well as bilateral anterior shoulder pain. This pain limits endurance with daily activities and sleeping. Notable tightness in Lt upper trap & scalenes as well as rounded shoulders resulting in  tightening of pectoralis musculature. Pt will benefit from skilled PT in order to improve postural alignment and decrease pain to reach functional goals.     History and Personal Factors relevant to plan of care:  h/o fall    Clinical Presentation  Stable    Clinical Decision Making  Low    Rehab Potential  Good    PT Frequency  -- 3 visits in first auth, followed by 2/week 4 weeks    PT Treatment/Interventions  ADLs/Self Care Home Management;Cryotherapy;Electrical Stimulation;Ultrasound;Traction;Moist Heat;Therapeutic activities;Therapeutic exercise;Patient/family education;Manual techniques;Passive range of motion;Taping;Dry needling    PT Next Visit Plan  STM to upper trap & scalenes, periscapular strength, door pec stretch, review sleeping posture    PT Home Exercise Plan  scap retraction, upper trap & scalene stretches    Consulted and Agree with Plan of Care  Patient       Patient will benefit from skilled therapeutic intervention in order to improve the following deficits and impairments:  Pain, Improper body mechanics, Postural dysfunction, Increased muscle spasms, Impaired UE functional use, Decreased activity tolerance  Visit Diagnosis: Cervicalgia - Plan: PT plan of care cert/re-cert  Chronic left shoulder pain - Plan: PT plan of care cert/re-cert  Pain in left arm - Plan: PT plan of care cert/re-cert  Chronic right shoulder pain - Plan: PT plan of care cert/re-cert     Problem List Patient Active Problem List   Diagnosis Date Noted  . Neck muscle strain, subsequent encounter 04/19/2017  . Carpal tunnel syndrome of left wrist 04/19/2017  . History of neonatal death 04/12/2017  . Status post repeat low transverse cesarean section 03/18/2017  . Conductive hearing loss of right ear with unrestricted hearing of left ear 11/04/2015  . Language barrier, speaks Arabic 05/31/2015    [place] C. Alecia Doi PT, DPT 10/11/17 10:37 AM   [facility]  [facility] [address] [place], [place], [address] Phone: [phone]   Fax:  [phone]  Name: Clearence Pedtimad A Gosdin MRN: [id number] Date of Birth: 07/16/1976

## 2017-10-27 ENCOUNTER — Ambulatory Visit: Payer: Medicaid Other | Admitting: Physical Therapy

## 2017-11-03 ENCOUNTER — Ambulatory Visit: Payer: Medicaid Other | Attending: Nurse Practitioner | Admitting: Physical Therapy

## 2017-11-03 ENCOUNTER — Encounter: Payer: Self-pay | Admitting: Physical Therapy

## 2017-11-03 DIAGNOSIS — M542 Cervicalgia: Secondary | ICD-10-CM | POA: Diagnosis present

## 2017-11-03 DIAGNOSIS — M25511 Pain in right shoulder: Secondary | ICD-10-CM | POA: Diagnosis present

## 2017-11-03 DIAGNOSIS — M79602 Pain in left arm: Secondary | ICD-10-CM | POA: Insufficient documentation

## 2017-11-03 DIAGNOSIS — M25512 Pain in left shoulder: Secondary | ICD-10-CM | POA: Insufficient documentation

## 2017-11-03 DIAGNOSIS — G8929 Other chronic pain: Secondary | ICD-10-CM | POA: Insufficient documentation

## 2017-11-03 NOTE — Therapy (Signed)
[facility] Outpatient Rehabilitation [facility] [address] [place], [place], [address] Phone: [phone]   Fax:  [phone]  Physical Therapy Treatment  Patient Details  Name: Cheryl Bauer MRN: [id number] Date of Birth: 1975/09/07 Referring Provider: Maudie Flakes, FNP   Encounter Date: 11/03/2017  PT End of Session - 11/03/17 0808    Visit Number  2    Number of Visits  4    Date for PT Re-Evaluation  11/12/17    Authorization Type  MCD 3 visits 4/2-4/29    PT Start Time  0808 pt arrived late    PT Stop Time  0848    PT Time Calculation (min)  40 min    Activity Tolerance  Patient tolerated treatment well    Behavior During Therapy  [facility] for tasks assessed/performed       Past Medical History:  Diagnosis Date  . 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 Headache    not specified by pt. - occasional  . Hearing loss in right ear 11/2015  . 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 CESAREAN SECTION    . CESAREAN SECTION N/A 03/18/2017   Procedure: REPEAT CESAREAN SECTION;  Surgeon: Adam Phenix, MD;  Location: [facility] BIRTHING SUITES;  Service: Obstetrics;  Laterality: N/A;  . STAPEDECTOMY Right 12/04/2015   Procedure: RIGHT SIDE TYMPANOTOMY EXPLORATORY WITH STAPEDECTOMY;  Surgeon: Serena Colonel, MD;  Location: [facility];  Service: ENT;  Laterality: Right;    There were no vitals filed for this visit.  Subjective Assessment - 11/03/17 0810    Subjective  Stabbing pain lateral and anterior neck, tightness on anterior chest. Has not been doing HEP as much as she should be.     Currently in Pain?  Yes    Pain Score  6     Pain Location  -- bil neck/shoulder/chest    Pain Orientation  Right;Left;Anterior;Lateral    Pain Descriptors / Indicators  Stabbing;Tightness                       [facility] Adult PT Treatment/Exercise - 11/03/17 0001      Neck Exercises: Machines for  Strengthening   UBE (Upper Arm Bike)  retro 3 min L1      Neck Exercises: Standing   Other Standing Exercises  row    Other Standing Exercises  GHJ flexion liftoff from wall      Neck Exercises: Supine   Neck Retraction  10 reps    Other Supine Exercise  UE horiz abd red tbad      Neck Exercises: Stretches   Other Neck Stretches  open book    Other Neck Stretches  door stretch             PT Education - 11/03/17 0823    Education provided  Yes    Education Details  use of tennis ball for STM, importance of regular HEP and rationale, CA diagnosis for Dad and anatomy of her condition/cause of pain    Person(s) Educated  Patient    Methods  Explanation;Demonstration;Tactile cues;Verbal cues;Handout    Comprehension  Verbalized understanding;Need further instruction;Returned demonstration;Verbal cues required;Tactile cues required       PT Short Term Goals - 10/11/17 1026      PT SHORT TERM GOAL #1   Title  Pt will be able to incoorporate HEP into her day for management  of pain    Baseline  began educating at eval    Time  4    Period  Weeks    Status  New    Target Date  11/12/17 to accomodate for MCD auth period      PT SHORT TERM GOAL #2   Title  Pt will be able to find a comfortable sleeping position without severe neck pain    Baseline  severe when laying down on Lt side at eval    Time  4    Period  Weeks    Status  New    Target Date  11/12/17        PT Long Term Goals - 10/11/17 1029      PT LONG TERM GOAL #1   Title  Average pain <=3/10 with daily activities    Baseline  severe at eval, 7/10 at rest    Time  8    Period  Weeks    Status  New    Target Date  12/10/17 to accomodate for MCD auth period      PT LONG TERM GOAL #2   Title  Pt will be able to reach overhead without increase in pain    Baseline  significant increase in pain at eval    Time  8    Period  Weeks    Status  New    Target Date  12/10/17      PT LONG TERM GOAL #3   Title   gross UE MMT 5/5 without increase in pain    Baseline  able to hold with significant increase in pain with all tests at eval    Time  8    Period  Weeks    Status  New    Target Date  12/10/17      PT LONG TERM GOAL #4   Title  Pt will be independent in long term HEP to continue strengthening and managing postural alignment through daily activities    Baseline  will establish and progress as appropriate    Time  8    Period  Weeks    Status  New    Target Date  12/10/17            Plan - 11/03/17 0849    Clinical Impression Statement  Pt is very fearful of possible CA because dad had CA and pain was in his shoulders. She said PCP has done all appropriate testing and tests were negative. Increased time for education on anatomy of condiiton and importance of regular exercises/rationale for exercises. Tightness across bil pectoralis with overuse of upper traps contributing to excessive tightness. will continue to challenge periscapular strength and postural awareness to reduce poor habits.     PT Treatment/Interventions  ADLs/Self Care Home Management;Cryotherapy;Electrical Stimulation;Ultrasound;Traction;Moist Heat;Therapeutic activities;Therapeutic exercise;Patient/family education;Manual techniques;Passive range of motion;Taping;Dry needling    PT Next Visit Plan  STM PRN, periscapular strength, review HEP    PT Home Exercise Plan  scap retraction, upper trap & scalene stretches, supine Horiz abd, rows, open book, door pec stretch    Consulted and Agree with Plan of Care  Patient       Patient will benefit from skilled therapeutic intervention in order to improve the following deficits and impairments:  Pain, Improper body mechanics, Postural dysfunction, Increased muscle spasms, Impaired UE functional use, Decreased activity tolerance  Visit Diagnosis: Cervicalgia  Chronic left shoulder pain  Pain in left arm  Chronic right shoulder  pain     Problem List Patient Active  Problem List   Diagnosis Date Noted  . Neck muscle strain, subsequent encounter 05/14/17  . 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 History of neonatal death 2017-05-07  . Status post repeat low transverse cesarean section 03/18/2017  . Conductive hearing loss of right ear with unrestricted hearing of left ear 11/04/2015  . Language barrier, speaks Arabic 05/31/2015    Cora Brierley C. Idabelle Mcpeters PT, DPT 11/03/17 10:54 AM   [facility] Outpatient Rehabilitation [facility] [address] [place], [place], [address] Phone: [phone]   Fax:  [phone]  Name: Cheryl Bauer MRN: [id number] Date of Birth: [birth year]

## 2017-11-09 ENCOUNTER — Telehealth: Payer: Self-pay | Admitting: Physical Therapy

## 2017-11-09 ENCOUNTER — Encounter: Payer: Medicaid Other | Admitting: Physical Therapy

## 2017-11-09 ENCOUNTER — Ambulatory Visit: Payer: Medicaid Other | Admitting: Physical Therapy

## 2017-11-09 NOTE — Telephone Encounter (Signed)
Interpreter spoke with pt son who reports she was not at home right now but will be at apt on Monday. Ermal Haberer C. Connelly [place] PT, DPT 11/09/17 8:18 AM

## 2017-11-10 ENCOUNTER — Encounter: Payer: Medicaid Other | Admitting: Physical Therapy

## 2017-11-15 ENCOUNTER — Ambulatory Visit: Payer: Medicaid Other | Admitting: Physical Therapy

## 2017-11-15 ENCOUNTER — Telehealth: Payer: Self-pay | Admitting: Physical Therapy

## 2017-11-15 NOTE — Telephone Encounter (Signed)
Interpreter unable to leave VM, not set up. One more appointment scheduled on 4/24. Mckaila Duffus C. Jennavecia Schwier PT, DPT 11/15/17 11:16 AM

## 2017-11-17 ENCOUNTER — Ambulatory Visit: Payer: Medicaid Other | Admitting: Physical Therapy

## 2017-12-14 ENCOUNTER — Encounter: Payer: Self-pay | Admitting: Physical Therapy

## 2017-12-14 NOTE — Therapy (Signed)
[place], [place], [address] Phone: [phone]   Fax:  [phone]  Patient Details  Name: Cheryl Bauer MRN: [id number] Date of Birth: 1975-11-04 Referring Provider:  No ref. provider found  Encounter Date: 12/14/2017   PHYSICAL THERAPY DISCHARGE SUMMARY  Visits from Start of Care: 2  Current functional level related to goals / functional outcomes:  Has not returned since last scheduled session- DC    Remaining deficits: Unable to assess    Education / Equipment: N/A  Plan: Patient agrees to discharge.  Patient goals were not met. Patient is being discharged due to not returning since the last visit.  ?????     Deniece Ree PT, DPT, CBIS  Supplemental Physical Therapist [place]   Pager [facility] [address] [place], [place], [address] Phone: [phone]   Fax:  [phone]

## 2018-01-22 ENCOUNTER — Encounter (HOSPITAL_COMMUNITY): Payer: Self-pay | Admitting: Emergency Medicine

## 2018-01-22 ENCOUNTER — Other Ambulatory Visit: Payer: Self-pay

## 2018-01-22 ENCOUNTER — Emergency Department (HOSPITAL_COMMUNITY)
Admission: EM | Admit: 2018-01-22 | Discharge: 2018-01-22 | Disposition: A | Discharge status: Home / Self Care | Payer: Medicaid Other | Attending: Emergency Medicine | Admitting: Emergency Medicine

## 2018-01-22 DIAGNOSIS — Z5321 Procedure and treatment not carried out due to patient leaving prior to being seen by health care provider: Secondary | ICD-10-CM | POA: Diagnosis not present

## 2018-01-22 DIAGNOSIS — H5713 Ocular pain, bilateral: Secondary | ICD-10-CM | POA: Diagnosis not present

## 2018-01-22 DIAGNOSIS — R21 Rash and other nonspecific skin eruption: Secondary | ICD-10-CM | POA: Insufficient documentation

## 2018-01-22 MED ORDER — TETRACAINE HCL 0.5 % OP SOLN
2.0000 [drp] | Freq: Once | OPHTHALMIC | Status: AC
  Administered 2018-01-22: 2 [drp] via OPHTHALMIC
  Filled 2018-01-22: qty 4

## 2018-01-22 MED ORDER — FLUORESCEIN SODIUM 1 MG OP STRP
1.0000 | ORAL_STRIP | Freq: Once | OPHTHALMIC | Status: AC
  Administered 2018-01-22: 1 via OPHTHALMIC
  Filled 2018-01-22: qty 1

## 2018-01-22 NOTE — ED Triage Notes (Signed)
Pt presents to ED for rash to bilateral hands, left elbow, and both eyes starting 5 days ago.  Pt has been applying topical benadryl and taking oral benadryl without relief.

## 2018-01-22 NOTE — ED Notes (Signed)
Patient son at nurses station every 10 minutes and pacing outside of room since patient was roomed. Repeatedly asking when the provider would be in to see the patient. Patient's son instructed that the patient has been triaged by a nurse, and has been further assessed by another nurse and is waiting for the provider to finish seeing a patient. Adelina MingsKelsey PA instructed family that a provider would be in shortly to see them. Patient's son then states "if I wanted to wait I would just stay home to die." He was informed that she has been receiving care for the entirety of her visit thus far. Patient's son and patient educated about the risks of leaving against medical advice but he states that they are leaving and escorted the patient out of the department, gait steady, and in NAD.

## 2018-08-17 IMAGING — US US MFM FETAL NUCHAL TRANSLUCENCY
1 series · 15 of 28 positions shown · non-contrast
Comparison: none

[Series 1: us mfm fetal nuchal translucency · 37 acquisitions, 15 frames shown]
[im 1/37]
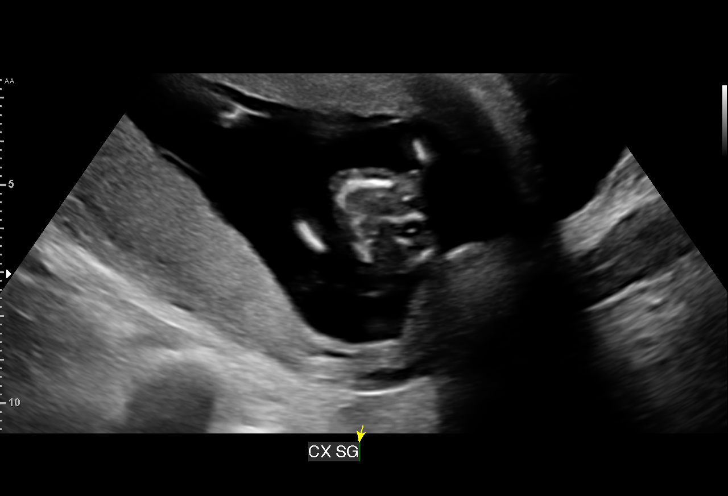
[im 3/37]
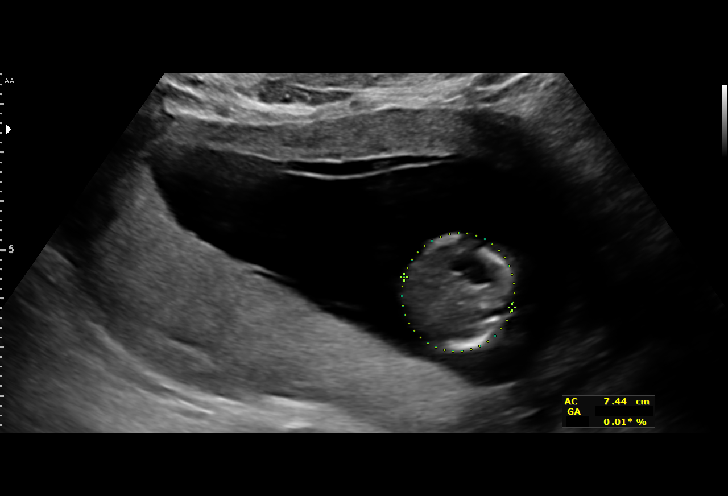
[im 6/37]
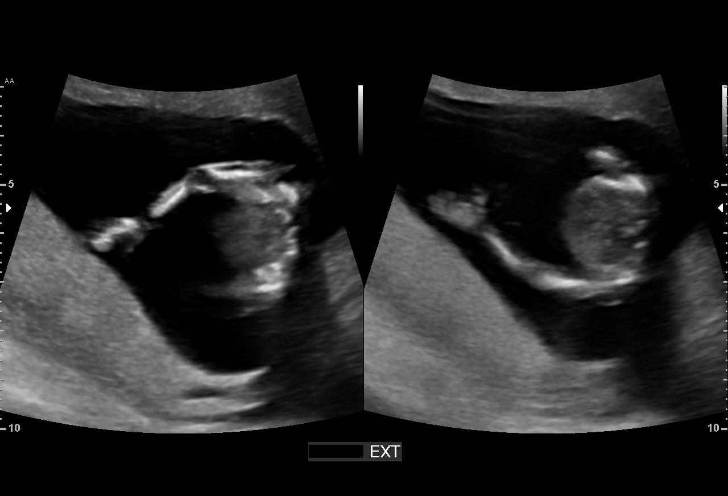
[im 9/37]
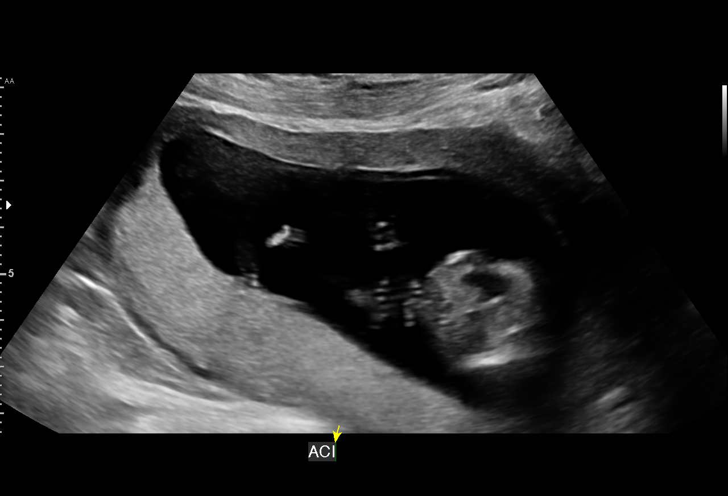
[im 11/37]
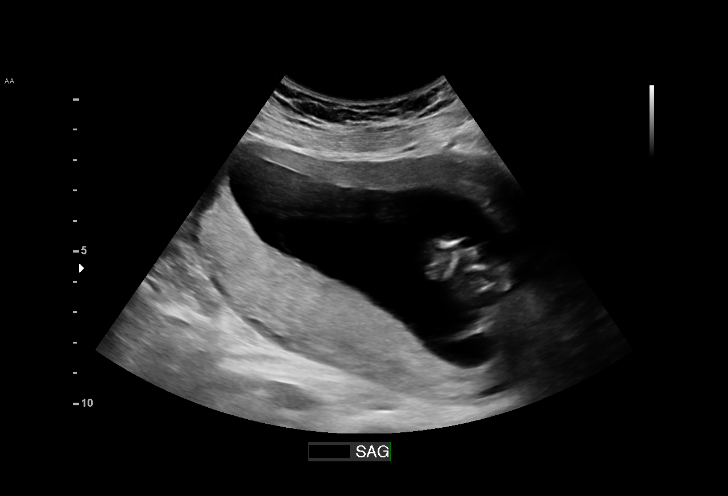
[im 14/37]
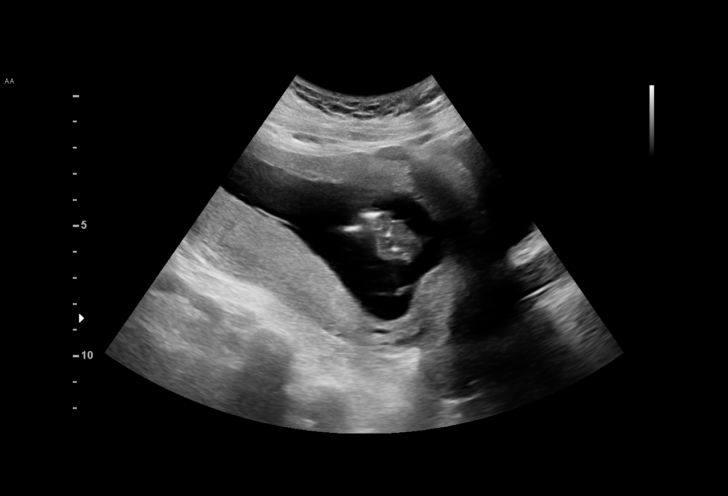
[im 17/37]
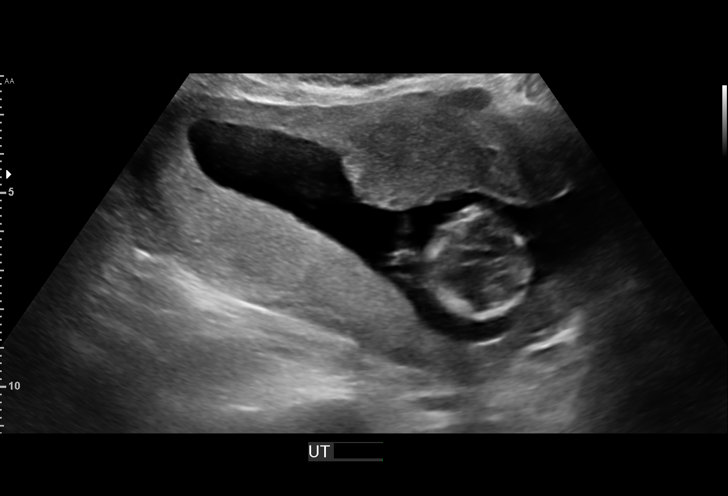
[im 19/37]
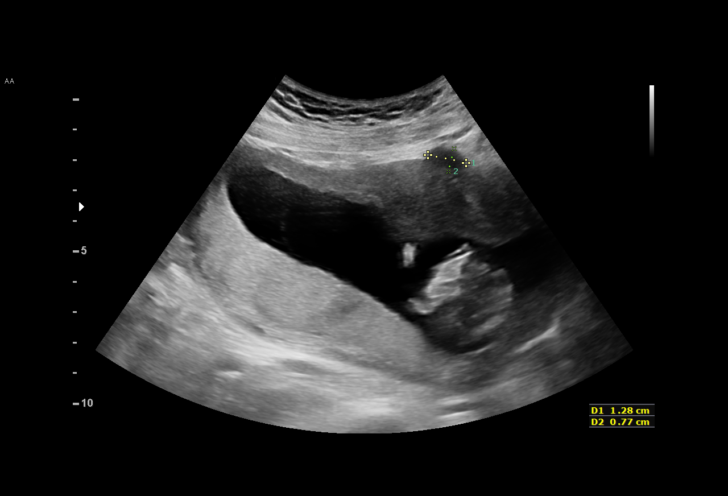
[im 21/37]
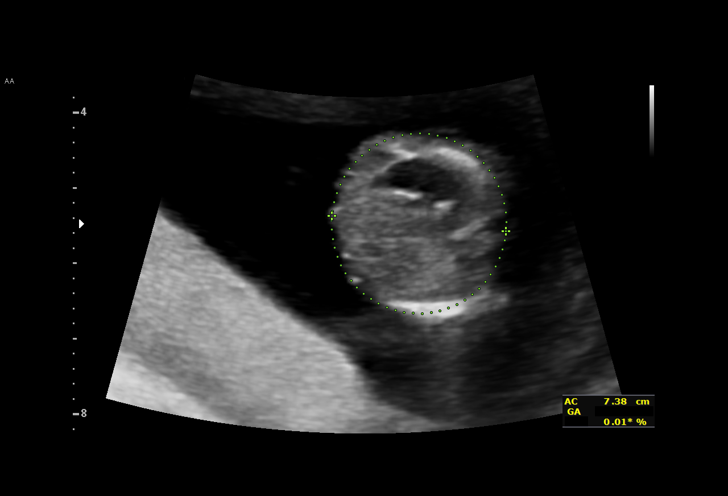
[im 23/37]
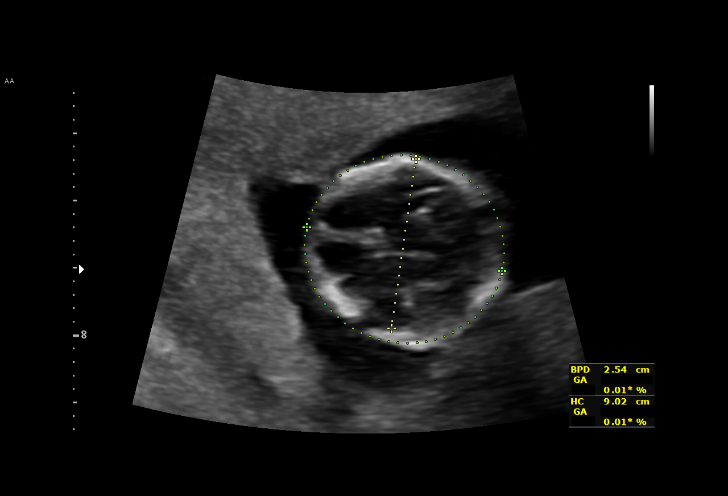
[im 26/37]
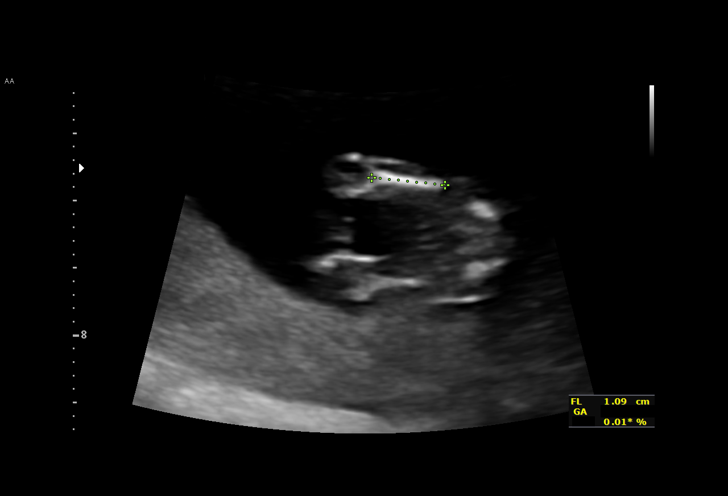
[im 29/37]
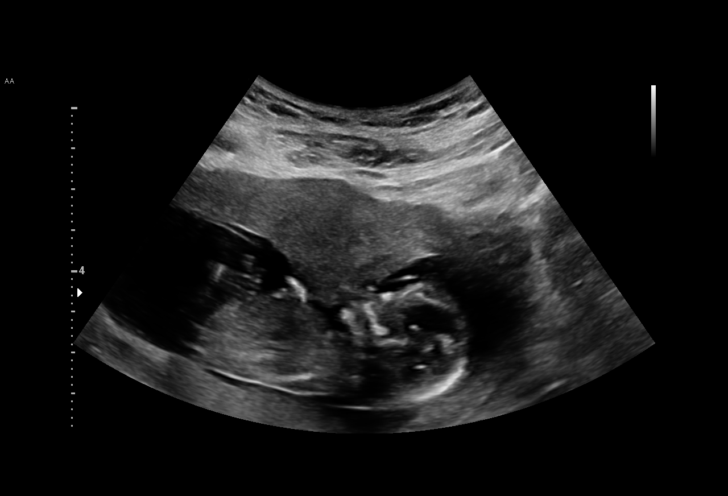
[im 31/37]
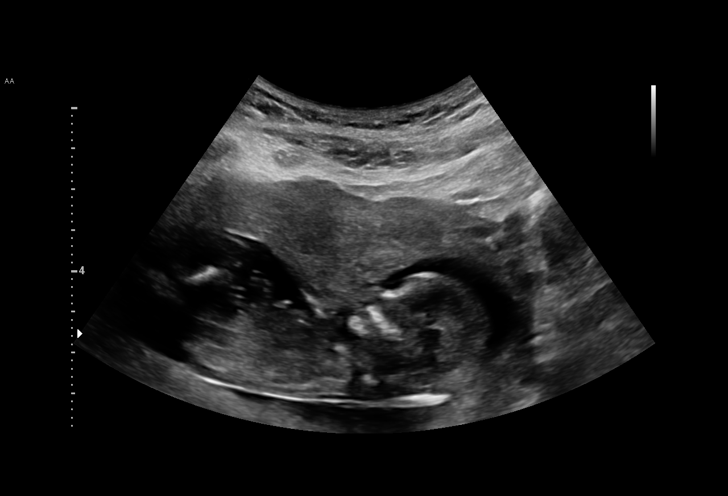
[im 34/37]
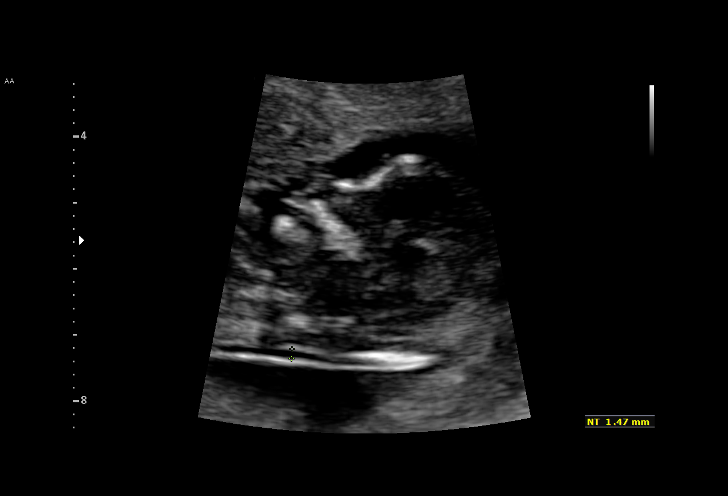
[im 37/37]
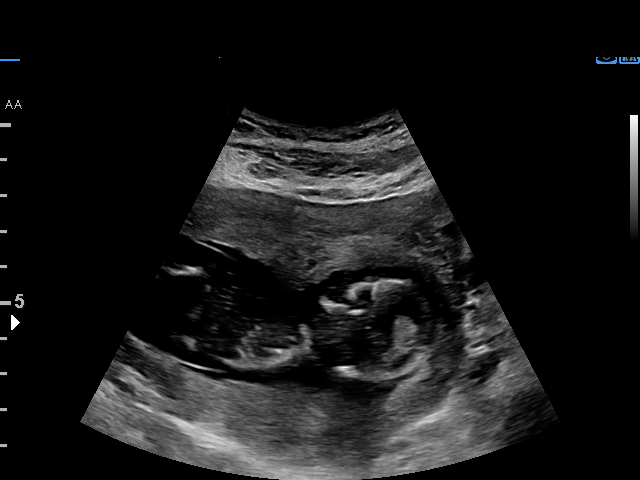

[15 of 28 positions shown; findings below may reference images not displayed]

Medicine Center
[address]
[place]

TRANSLUCENCY

1  CHOIR ADADA           [id number]      [id number]     [id number]
Indications

13 weeks gestation of pregnancy
Poor obstetric history: Previous neonatal
death
Advanced maternal age multigravida 35+
(41), first trimester
Encounter for nuchal translucency
OB History

Blood Type:            Height:  5'4"   Weight (lb):  168.4     BMI:
Gravidity:    6         Term:   5
Living:       4
Fetal Evaluation

Num Of Fetuses:     1
Fetal Heart         162
Rate(bpm):
Cardiac Activity:   Observed
Presentation:       Vertex
Placenta:           Posterior, above cervical os

Amniotic Fluid
AFI FV:      Subjectively within normal limits

Largest Pocket(cm)
3.4
Biometry
BPD:      24.7  mm     G. Age:  14w 2d                  CI:         77.22  %    70 - 86
FL/HC:       12.1  %
HC:         89  mm     G. Age:  14w 0d                  HC/AC:       1.20       1.14 -
AC:       74.1  mm     G. Age:  13w 6d                  FL/BPD:      43.7  %
FL:       10.8  mm     G. Age:  13w 2d                  FL/AC:       14.6  %    20 - 24

Est. FW:      79   gm     0 lb 3 oz
Gestational Age

LMP:           18w 0d        Date:  05/20/16                 EDD:    02/24/17
U/S Today:     13w 6d                                        EDD:    03/25/17
Best:          13w 6d     Det. By:  U/S (09/23/16)           EDD:    03/25/17
Cervix Uterus Adnexa

Cervix
Length:              4  cm.
Normal appearance by transabdominal scan.
Myomas

Site                     L(cm)      W(cm)      D(cm)       Location
Anterior                 1.3        0.8        1.1         Subserosal

Blood Flow                 RI        PI       Comments

Impression

SIUP at 13+6 weeks
No gross abnormalities identified
Normal amniotic fluid volume
EDC based on today's measurements: 03/25/17

Ms. Gantt declined genetic counseling and aneuploidy testing.
Recommendations

Offer MSAFP in the second trimester for ONTD screening
Offer detailed anatomy U/S by 18 weeks

## 2018-09-26 IMAGING — US US MFM OB DETAIL+14 WK
1 series · 14 of 28 positions shown · non-contrast
Comparison: none

[Series 1: us mfm ob detail+14 wk · 64 acquisitions, 14 frames shown]
[im 3/64]
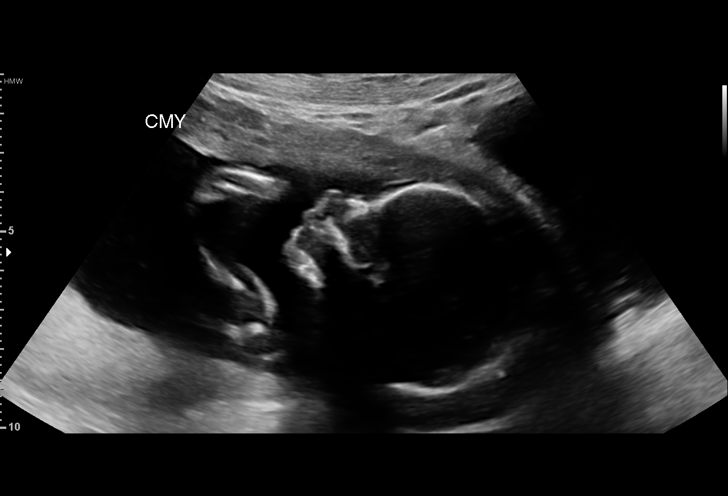
[im 8/64]
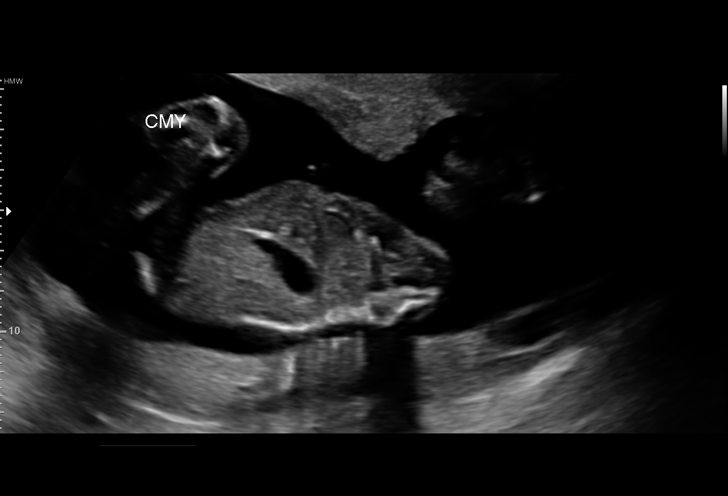
[im 12/64]
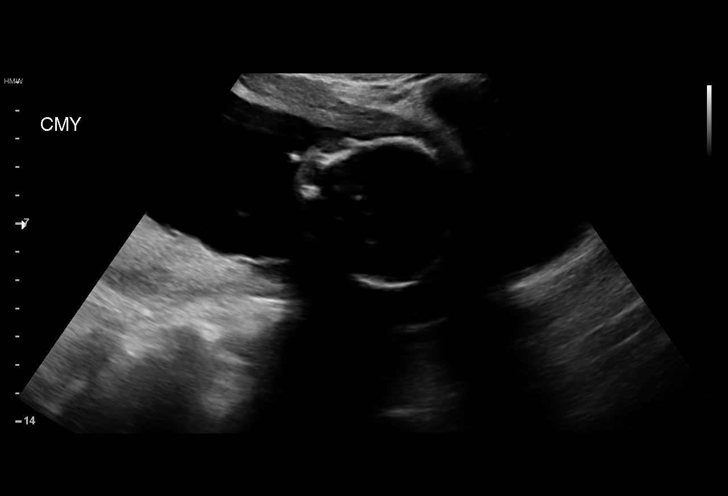
[im 17/64]
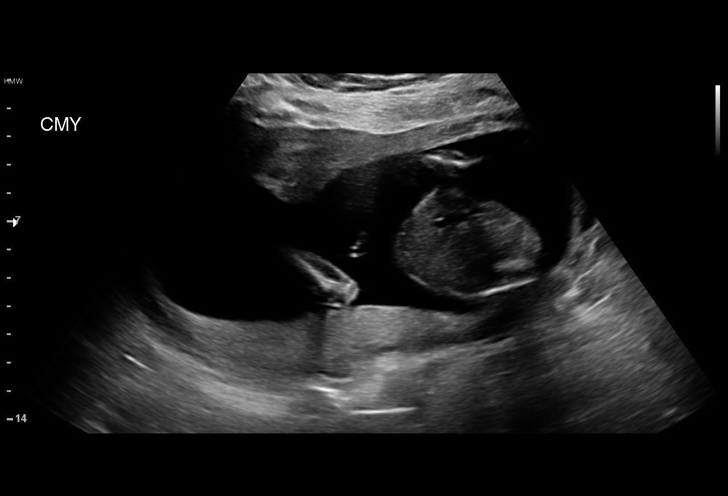
[im 22/64]
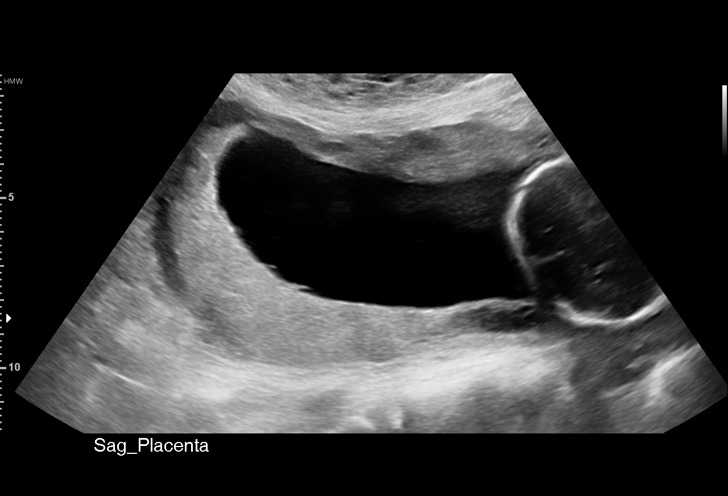
[im 26/64]
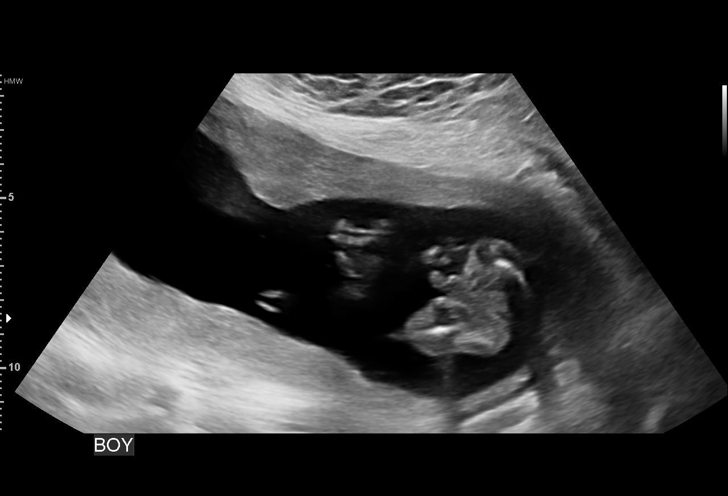
[im 31/64]
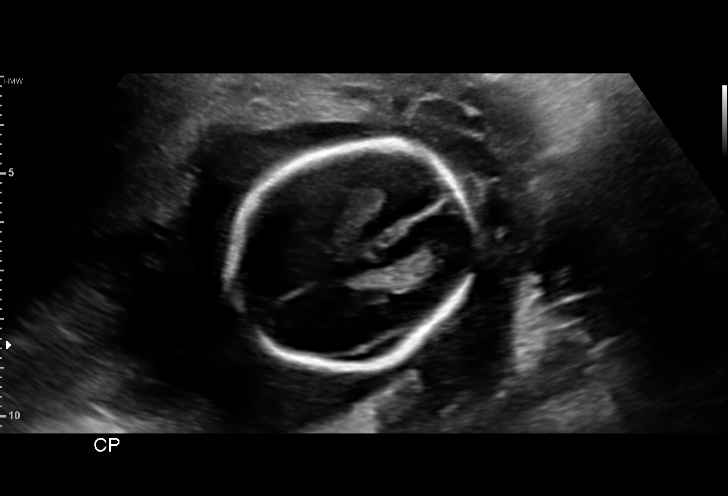
[im 36/64]
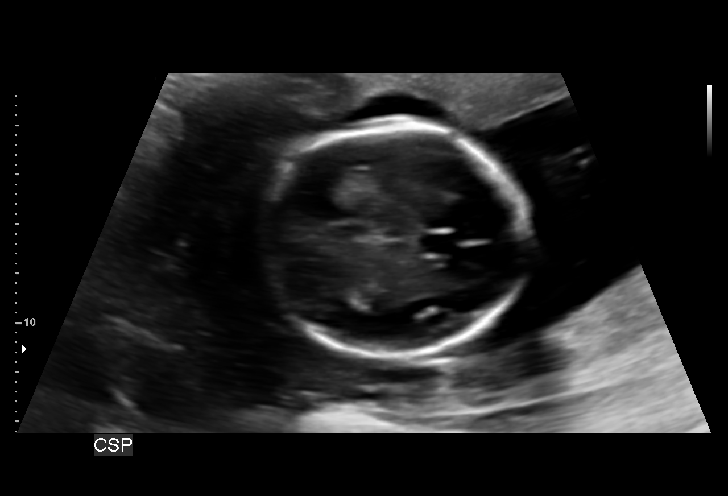
[im 40/64]
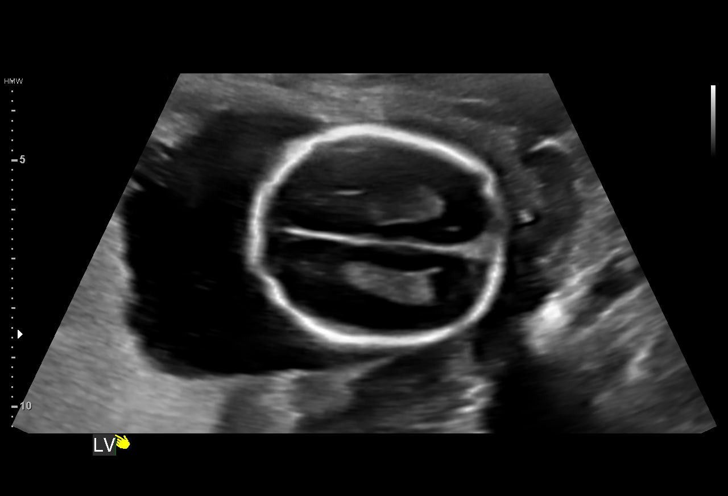
[im 45/64]
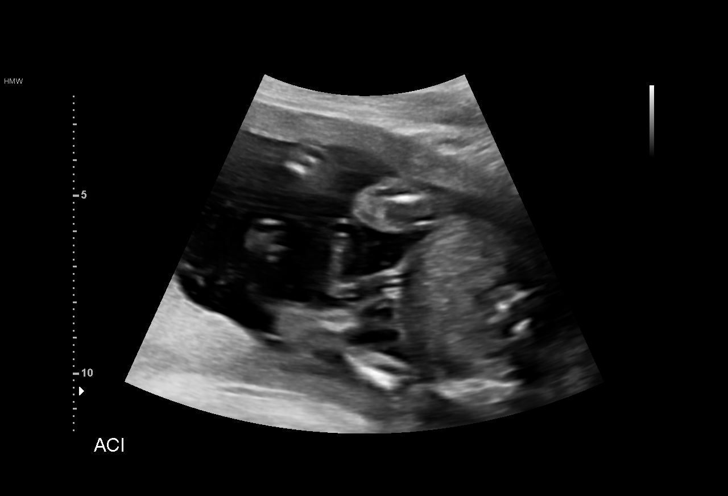
[im 50/64]
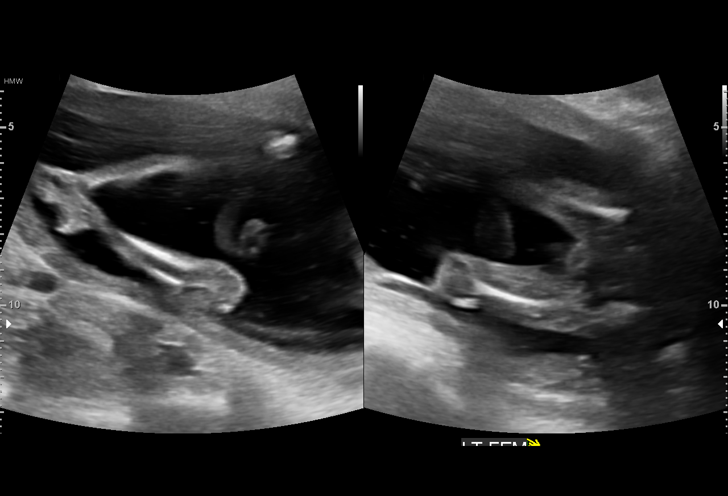
[im 54/64]
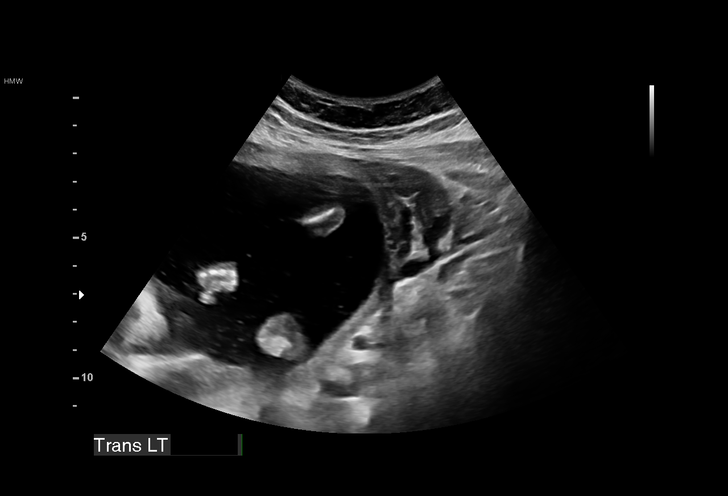
[im 59/64]
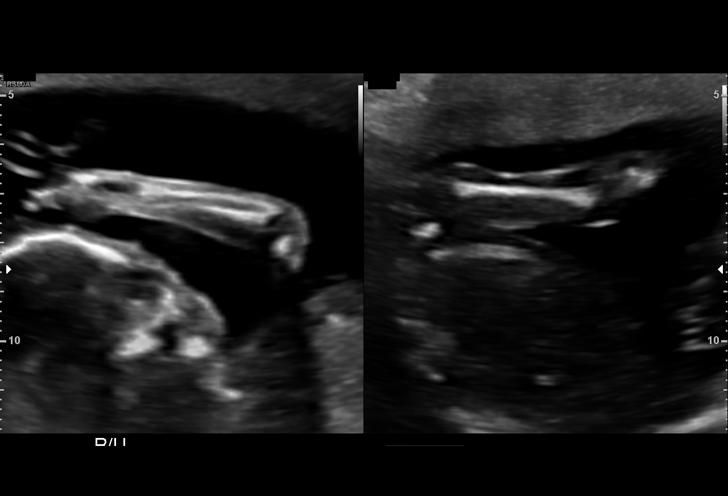
[im 64/64]
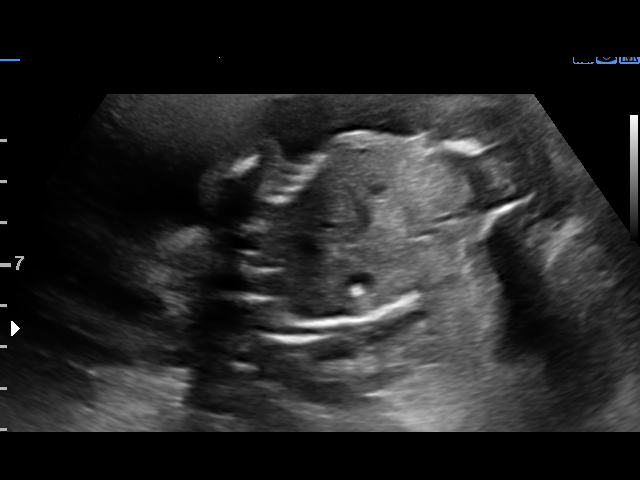

[14 of 28 positions shown; findings below may reference images not displayed]

Medicine Center
[address]
[place]
Attending:        Ourari Tiger        Secondary Phy.:   [facility]-
MAU/Triage

1  BUZUU GATCHALIAN            [id number]      [id number]     [id number]
Indications

19 weeks gestation of pregnancy
Poor obstetric history: Previous neonatal
death
Advanced maternal age multigravida 35+,
second trimester(declines screening)
Previous cesarean delivery, antepartum (x3)
Encounter for antenatal screening for
chromosomal anomalies
OB History

Blood Type:            Height:  5'4"   Weight (lb):  168.4    BMI:
Gravidity:    6         Term:   5
Living:       4
Fetal Evaluation

Num Of Fetuses:     1
Fetal Heart         146
Rate(bpm):
Cardiac Activity:   Observed
Presentation:       Cephalic
Placenta:           Posterior, above cervical os
P. Cord Insertion:  Visualized, central

Amniotic Fluid
AFI FV:      Subjectively within normal limits

Largest Pocket(cm)
5.4
Biometry

BPD:        46  mm     G. Age:  19w 6d         65  %    CI:        79.12   %   70 - 86
FL/HC:      17.5   %   16.8 -
HC:      163.5  mm     G. Age:  19w 1d         22  %    HC/AC:      1.13       1.09 -
AC:      144.7  mm     G. Age:  19w 5d         52  %    FL/BPD:     62.2   %
FL:       28.6  mm     G. Age:  18w 5d         18  %    FL/AC:      19.8   %   20 - 24
HUM:      31.2  mm     G. Age:  20w 3d         73  %

Est. FW:     286  gm    0 lb 10 oz      42  %
Gestational Age

LMP:           23w 5d       Date:   05/20/16                 EDD:   02/24/17
U/S Today:     19w 3d                                        EDD:   03/26/17
Best:          19w 4d    Det. By:   U/S  (09/23/16)          EDD:   03/25/17
Anatomy

Cranium:               Appears normal         Aortic Arch:            Appears normal
Cavum:                 Appears normal         Ductal Arch:            Not well visualized
Ventricles:            Appears normal         Diaphragm:              Appears normal
Choroid Plexus:        Appears normal         Stomach:                Appears normal, left
sided
Cerebellum:            Appears normal         Abdomen:                Appears normal
Posterior Fossa:       Appears normal         Abdominal Wall:         Appears nml (cord
insert, abd wall)
Nuchal Fold:           Appears normal         Cord Vessels:           Appears normal (3
vessel cord)
Face:                  Orbits nl; profile not Kidneys:                Appear normal
well visualized
Lips:                  Not well visualized    Bladder:                Appears normal
Thoracic:              Appears normal         Spine:                  Not well visualized
Heart:                 Not well visualized    Upper Extremities:      Appears normal
RVOT:                  Not well visualized    Lower Extremities:      Appears normal
LVOT:                  Not well visualized

Other:  Fetus appears to be a male. Heels visualized. Rt 5th visualized.
Technically difficult due to maternal habitus and fetal position.
Cervix Uterus Adnexa

Cervix
Length:            3.5  cm.
Normal appearance by transabdominal scan.

Uterus
No abnormality visualized.

Left Ovary
No adnexal mass visualized.

Right Ovary
No adnexal mass visualized.

Cul De Sac:   No free fluid seen.

Adnexa:       No abnormality visualized.
Impression

Single IUP at 19w 4d
Advanced maternal age, hx of C-section x 3
Limited views of the face, fetal heart and spine obtained
No markers associated with aneuploidy noted
Posterior placenta without previa
Normal amniotic fluid volume
Recommendations

Recommend follow-up ultrasound examination in 4 weeks to
reevaluate fetal anatomy

## 2022-03-19 ENCOUNTER — Ambulatory Visit (INDEPENDENT_AMBULATORY_CARE_PROVIDER_SITE_OTHER): Payer: Medicaid Other | Admitting: Family Medicine

## 2022-03-19 ENCOUNTER — Encounter: Payer: Self-pay | Admitting: Family Medicine

## 2022-03-19 VITALS — BP 118/83 | HR 74 | Ht 60.24 in | Wt 167.4 lb

## 2022-03-19 DIAGNOSIS — R4589 Other symptoms and signs involving emotional state: Secondary | ICD-10-CM | POA: Diagnosis not present

## 2022-03-19 DIAGNOSIS — F419 Anxiety disorder, unspecified: Secondary | ICD-10-CM

## 2022-03-19 DIAGNOSIS — F329 Major depressive disorder, single episode, unspecified: Secondary | ICD-10-CM

## 2022-03-19 MED ORDER — FLUOXETINE HCL 20 MG PO TABS: 20.0000 mg | ORAL_TABLET | Freq: Every day | ORAL | 1 refills | Status: DC

## 2022-03-19 MED ORDER — HYDROXYZINE HCL 10 MG PO TABS: 10.0000 mg | ORAL_TABLET | Freq: Three times a day (TID) | ORAL | 2 refills | Status: DC | PRN

## 2022-03-19 NOTE — Patient Instructions (Signed)
I have translated the following text using Google translate.  As such, there are many errors.  I apologize for the poor written translation; however, we do not have written  translation services yet. It was wonderful to meet you today. Thank you for allowing me to be a part of your care. Below is a short summary of what we discussed at your visit today:  Depressed mood, tearfulness, anxiety Start prozac (fluoxetine) once daily every day. This is the long term daily medication to help with mood.   Come back in 2 weeks to see how you are tolerating the medication and likely increase the dose.   Start hydroxyzine to be used only as needed. You can take this when you have an anxiety attack or anxiety episode. This medicine may make you sleepy, so try it first at home.     If you have any questions or concerns, please do not hesitate to contact us via phone or MyChart message.   Fayette Pho, MD
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
## 2022-03-25 DIAGNOSIS — R4589 Other symptoms and signs involving emotional state: Secondary | ICD-10-CM | POA: Insufficient documentation

## 2022-03-25 NOTE — Assessment & Plan Note (Signed)
Chronic, worsening.  Suspect element of reactive depression given onset after severe psychosocial stressors. No SI, HI, or thoughts of self-harm.  She has never before tried counseling or medications for this.  Discussed added benefit of using both treatment methods, patient amenable. - Start fluoxetine 20 mg daily - Start hydroxyzine 10 mg tablets 3 times daily as needed - Return in 2 weeks for follow-up of tolerance - Referral to community care coordination for assistance with connection to resources

## 2022-04-03 ENCOUNTER — Ambulatory Visit (INDEPENDENT_AMBULATORY_CARE_PROVIDER_SITE_OTHER): Payer: Self-pay | Admitting: Family Medicine

## 2022-04-03 ENCOUNTER — Encounter: Payer: Self-pay | Admitting: Family Medicine

## 2022-04-03 DIAGNOSIS — R4589 Other symptoms and signs involving emotional state: Secondary | ICD-10-CM

## 2022-04-03 NOTE — Patient Instructions (Addendum)
I have translated the following text using Google translate.  As such, there are many errors.  I apologize for the poor written translation; however, we do not have written  translation services yet. It was wonderful to see you today. Thank you for allowing me to be a part of your care. Below is a short summary of what we discussed at your visit today: ??? ??? ?????? ???? ?????? ???????? ????? ????. ???? ??? ?????? ???? ?????? ?? ???????. ????? ?? ??????? ???????? ??????? ??? ???? ??? ????? ????? ????? ?????? ??? ????. ??? ?? ?????? ????? ?????. ????? ?? ??? ?????? ?? ??? ???? ????? ?? ??????. ????? ??? ???? ???? ??? ??????? ???? ?????? ?????:  Anxious mood ???? ???  Prozac This is the long-term medicine to help with anxious moods and depressed moods.  Take 1 pill once per day. This medication helps gradually.  We expect to see improvement in mood in about 6 weeks. Do not stop this medicine suddenly.  If you want to stop this medicine, we need to reduce the amount slowly.  ?????? ??? ?? ?????? ???? ????? ???????? ?? ????? ????? ???????? ???????? ????????. ????? ??? ????? ??? ????? ??????. ??? ?????? ????? ???????. ????? ?? ??? ?????? ?? ?????? ???????? ???? 6 ?????? ???????. ?? ????? ?? ??? ?????? ????. ??? ??? ???? ????? ??? ??????? ????? ????? ?????? ????.  Hydroxyzine This is an as needed medicine to help with bouts or episodes or attacks of anxiety. Take 1 pill only when you have extra anxiety. It is safe to take this medicine up to 3 times a day if you really need it that much. ??????????? ??? ???? ??? ?????? ???????? ?? ????? ?? ????? ?? ????? ?????. ????? ??? ????? ??? ????? ???? ???? ??? ?????. ?? ????? ????? ??? ?????? ??? 3 ???? ?????? ??? ??? ?? ???? ???? ????.   Please bring all of your medications to every appointment! ???? ????? ???? ??????? ?????? ?? ?? ?? ????!  If you have any questions or concerns, please do not hesitate to contact us via phone or MyChart message.   ??? ???? ???? ??? ????? ?? ?????????? ??? ????? ?? ??????? ??? ??? ?????? ?? ??? ????? MyChart.  Fayette Pho, MD

## 2022-04-07 NOTE — Assessment & Plan Note (Signed)
Due to language barrier, she has mistakenly start Prozac TID. Through Arabic interpreter, we provided better written instructions. She is to start Prozac one time daily. Explained hydroxyzine is intended for TID PRN use. Follow up in 2 weeks for assessment of tolerance and side effects.

## 2022-04-07 NOTE — Progress Notes (Signed)
    SUBJECTIVE:   CHIEF COMPLAINT / HPI:   Ms. Dino presents today for follow up of mood. At her last visit, she was started on prozac and hydroxyzine.   She speaks Arabic and has an in-person interpreter present for the entirety of the visit.   Patient reports only getting one med from the pharmacy. She cannot recall which one, but has has been taking it 3 times daily. With the help of the interpreter, we were able to conclude she has been taking the prozac 3 times daily. She has not yet started the hydroxyzine. Tolerating this well, no stomach upset, nausea, or vomiting.   PERTINENT  PMH / PSH:  Patient Active Problem List   Diagnosis Date Noted   Depressed mood 03/25/2022   Neck muscle strain, subsequent encounter Apr 23, 2017   Carpal tunnel syndrome of left wrist 04/23/17   History of neonatal death 16-Apr-2017   Status post repeat low transverse cesarean section 03/18/2017   Conductive hearing loss of right ear with unrestricted hearing of left ear 11/04/2015   Language barrier, speaks Arabic 05/31/2015    OBJECTIVE:   BP (!) 145/90   Pulse 82   Ht 5' (1.524 m)   Wt 173 lb (78.5 kg)   LMP  (LMP Unknown)   SpO2 100%   BMI 33.79 kg/m    PHQ-9:     04/03/2022    4:02 PM 03/19/2022    4:19 PM 04/23/17    3:09 PM  Depression screen PHQ 2/9  Decreased Interest 2 3 0  Down, Depressed, Hopeless 3 3 0  PHQ - 2 Score 5 6 0  Altered sleeping 2 3   Tired, decreased energy 3 3   Change in appetite 3 2   Feeling bad or failure about yourself  1 1   Trouble concentrating 3 2   Moving slowly or fidgety/restless 0 0   Suicidal thoughts 0 0   PHQ-9 Score 17 17   Difficult doing work/chores Somewhat difficult Extremely dIfficult    Physical Exam General: Awake, alert, oriented Cardiovascular: Regular rate and rhythm, S1 and S2 present, no murmurs auscultated Respiratory: Lung fields clear to auscultation bilaterally Extremities: No bilateral lower extremity edema, palpable  pedal and pretibial pulses bilaterally Neuro: Cranial nerves II through X grossly intact, able to move all extremities spontaneously   ASSESSMENT/PLAN:   Depressed mood Due to language barrier, she has mistakenly start Prozac TID. Through Arabic interpreter, we provided better written instructions. She is to start Prozac one time daily. Explained hydroxyzine is intended for TID PRN use. Follow up in 2 weeks for assessment of tolerance and side effects.    Fayette Pho, MD [facility]

## 2022-04-17 ENCOUNTER — Ambulatory Visit (INDEPENDENT_AMBULATORY_CARE_PROVIDER_SITE_OTHER): Payer: Self-pay | Admitting: Family Medicine

## 2022-04-17 ENCOUNTER — Encounter: Payer: Self-pay | Admitting: Family Medicine

## 2022-04-17 VITALS — BP 114/84 | HR 74 | Ht 60.0 in | Wt 172.1 lb

## 2022-04-17 DIAGNOSIS — Z23 Encounter for immunization: Secondary | ICD-10-CM

## 2022-04-17 DIAGNOSIS — R4589 Other symptoms and signs involving emotional state: Secondary | ICD-10-CM

## 2022-04-17 MED ORDER — FLUOXETINE HCL 20 MG PO TABS: 20.0000 mg | ORAL_TABLET | Freq: Every day | ORAL | 3 refills | Status: DC

## 2022-04-17 NOTE — Patient Instructions (Signed)
I have translated the following text using Google translate.  As such, there are many errors.  I apologize for the poor written translation; however, we do not have written  translation services yet. It was wonderful to see you today. Thank you for allowing me to be a part of your care. Below is a short summary of what we discussed at your visit today: ??? ??? ?????? ???? ?????? ???????? ????? ????. ???? ??? ?????? ???? ?????? ?? ???????. ????? ?? ??????? ???????? ??????? ??? ???? ??? ????? ????? ????? ?????? ??? ????. ??? ?? ?????? ????? ?????. ????? ?? ??? ?????? ?? ??? ???? ????? ?? ??????. ????? ??? ???? ???? ??? ??????? ???? ?????? ?????:  Mood Increase your daily medicine (Prozac, also called fluoxetine) from 10 mg a day to 20 mg a day.  This means of your current pill bottle, please take 2 tablets every day with food.  Once you are out of that bottle and you pick up the new medicine bottle, that bottle will contain 20 mg tablets.  At that time, you will only have to take 1 pill because that 1 pill is 20 mg.  Come back in 3 to 4 weeks at your appointment to see how you are doing.  If you have any questions or concerns, please do not hesitate to contact us via phone or MyChart message.   ???? ?? ?????? ???? ????? ?????? (??????? ??????? ????? ???? ????????) ?? 10 ??? ?????? ??? 20 ??? ??????. ??? ???? ????? ???? ??? ????? ??????? ?????? ??? ???? ????? ????? ?? ??? ?? ??????.  ????? ????? ?? ??? ??????? ??????? ????? ?????? ???????? ?????? ??? ??????? ??? 20 ??? ?? ???????. ?? ??? ?????? ????? ???? ????? ??? ????? ??? ??? ????? ??????? ????? ??? 20 ???.  ?? ??????? ???? 3 ??? 4 ?????? ?? ????? ???? ??? ????.  ??? ???? ???? ??? ????? ?? ?????????? ??? ????? ?? ??????? ??? ??? ?????? ?? ??? ????? MyChart.  Ezequiel Essex, MD

## 2022-04-17 NOTE — Progress Notes (Unsigned)
    SUBJECTIVE:   CHIEF COMPLAINT / HPI:   Mood follow up Ms. Scogin presents for follow up of anxiety and depressed mood after medication start. An in-person Arabic interpreter is used for the entirety of the appointment.   Last seen for this 9/08, had not yet started Prozac and hydroxyzine as prescribed due to language barrier. Today, she reports she has been taking Prozac every day since the last appointment. She has noticed a small improvement in her mood, but is still having depressed mood and crying. No diarrhea, nausea, vomiting. No SI.   She has not yet tried the hydroxyzine because she is concerned about the sedative effects. She is very busy with work and caring for her children with very little time to rest. She fears that if she becomes too sedated and falls asleep, she will not complete her responsibilities.   PERTINENT  PMH / PSH:  Patient Active Problem List   Diagnosis Date Noted   Depressed mood 03/25/2022   Carpal tunnel syndrome of left wrist April 30, 2017   History of neonatal death 04-23-2017   Conductive hearing loss of right ear with unrestricted hearing of left ear 11/04/2015   Language barrier, speaks Arabic 05/31/2015    OBJECTIVE:   BP 114/84   Pulse 74   Ht 5' (1.524 m)   Wt 172 lb 2 oz (78.1 kg)   LMP 03/27/2022   SpO2 100%   BMI 33.62 kg/m    PHQ-9:     04/17/2022    3:36 PM 04/03/2022    4:02 PM 03/19/2022    4:19 PM  Depression screen PHQ 2/9  Decreased Interest 2 2 3   Down, Depressed, Hopeless 3 3 3   PHQ - 2 Score 5 5 6   Altered sleeping 2 2 3   Tired, decreased energy 2 3 3   Change in appetite 3 3 2   Feeling bad or failure about yourself  2 1 1   Trouble concentrating 3 3 2   Moving slowly or fidgety/restless 0 0 0  Suicidal thoughts 0 0 0  PHQ-9 Score 17 17 17   Difficult doing work/chores  Somewhat difficult Extremely dIfficult    Physical Exam General: Awake, alert, oriented, no acute distress, intermittently tearful Respiratory:  Unlabored respirations, speaking in full sentences, no respiratory distress Psych: Normal insight and judgement   ASSESSMENT/PLAN:   Depressed mood Stable. Interval improvement of mood with Prozac daily, no adverse side effects. Encouraged to try hydroxyzine an hour or two before bed to see how it affects her. No SI/HI. Next check in scheduled for 3 weeks from now.     Ezequiel Essex, MD [place]

## 2022-04-18 ENCOUNTER — Encounter: Payer: Self-pay | Admitting: Family Medicine

## 2022-04-18 NOTE — Assessment & Plan Note (Signed)
Stable. Interval improvement of mood with Prozac daily, no adverse side effects. Encouraged to try hydroxyzine an hour or two before bed to see how it affects her. No SI/HI. Next check in scheduled for 3 weeks from now.

## 2022-05-11 NOTE — Patient Instructions (Incomplete)
I have translated the following text using Google translate.  As such, there are many errors.  I apologize for the poor written translation; however, we do not have written  translation services yet. It was wonderful to see you today. Thank you for allowing me to be a part of your care. Below is a short summary of what we discussed at your visit today: ??? ??? ?????? ???? ?????? ???????? ????? ????. ???? ??? ?????? ???? ?????? ?? ???????. ????? ?? ??????? ???????? ??????? ??? ???? ??? ????? ????? ????? ?????? ??? ????. ??? ?? ?????? ????? ?????. ????? ?? ??? ?????? ?? ??? ???? ????? ?? ??????. ????? ??? ???? ???? ??? ??????? ???? ?????? ?????:  Mood Take prozac 40 mg (1 tablet) every day. This is your daily medicine to improve mood symptoms.  **Warning: the new medicine from [place] [facility] will be 40 mg in one capsule. You only need to take one capsule!** Take the hydroxyzine 10 mg as needed for anxiety episodes. You may use up to 3 times in one day. If the 10 mg tablet makes you sleepy, you can break it in half and take only half.   I sent your prescriptions to the [facility] at [place] [place].  Pharmacy phone number [phone]. The prozac medication should be $4 because it is on the [facility] $4 list. The hydroxyzine will be discounted price, hopefully around $10 through the good Rx program.  Go online and search "good Rx".  You can find a code to present to your pharmacist through their website. ???? ????? ?????? 40 ??? (??? ????) ?? ???. ??? ?? ????? ?????? ?????? ????? ??????. **?????: ?????? ?????? ?? ??? ???? ????? 40 ??? ?? ?????? ?????. ?? ???? ??? ????? ?????? ?????!** ?? ??????????? 10 ??? ??? ?????? ?????? ?????. ????? ??????? ?? ??? ??? 3 ???? ?? ??? ????. ??? ??? ??? 10 ??? ????? ???? ???????? ?????? ?????? ??? ????? ?????? ????? ???.  ??? ????? ?????? ?????? ??? [facility] ?? [address] [place]. ??? ???? ???????? [phone]. ??? ?? ???? ??? ????  ???????? 4 ??????? ???? ???? ?? ????? [facility] ??????? 4 ???????. ???? ??? ??? ?????????????? ????? ?? ???? ????? 10 ??????? ?? ???? ?????? Rx ?????.  ???? ????????? ????? ?? "good Rx". ????? ?????? ??? ??? ??????? ??? ??????? ????? ?? ?? ???? ????? ??? ????????.  N648 evaluation I have sent our scheduler for the refugee clinic a note that you would like an appointment for the [phone] evaluation to be excused from the citizenship test.  Our scheduler will call you when she booked you an appointment. ????? [id number] ??? ????? ????? ??????? ????? ?? ????? ???????? ?????? ???? ?????? ?? ????? ???? ????? N648 ?? ?????? ???????. ????? ?? ????? ??????? ????? ????? ???? ?? ??????.  PAP smear You are due for a PAP smear to screen for cervical cancer. Please schedule this at your convenience.  ???? ??? ????? ?? ?????? ?? ????? ????? ??? ????? ????? ?? ????? ??? ?????. ???? ????? ??? ??? ?????.  Please bring all of your medications to every appointment! ???? ????? ???? ??????? ?????? ?? ?? ?? ????!  If you have any questions or concerns, please do not hesitate to contact us via phone or MyChart message.  ??? ???? ???? ??? ????? ?? ?????????? ??? ????? ?? ??????? ??? ??? ?????? ?? ??? ????? MyChart.  Ezequiel Essex, MD

## 2022-05-12 ENCOUNTER — Ambulatory Visit (INDEPENDENT_AMBULATORY_CARE_PROVIDER_SITE_OTHER): Payer: Self-pay | Admitting: Family Medicine

## 2022-05-12 ENCOUNTER — Encounter: Payer: Self-pay | Admitting: Family Medicine

## 2022-05-12 DIAGNOSIS — R4589 Other symptoms and signs involving emotional state: Secondary | ICD-10-CM

## 2022-05-12 DIAGNOSIS — F419 Anxiety disorder, unspecified: Secondary | ICD-10-CM

## 2022-05-12 DIAGNOSIS — Z0289 Encounter for other administrative examinations: Secondary | ICD-10-CM

## 2022-05-12 MED ORDER — FLUOXETINE HCL 40 MG PO CAPS: 40.0000 mg | ORAL_CAPSULE | Freq: Every day | ORAL | 3 refills | Status: DC

## 2022-05-12 MED ORDER — HYDROXYZINE HCL 10 MG PO TABS: 10.0000 mg | ORAL_TABLET | Freq: Three times a day (TID) | ORAL | 11 refills | Status: DC | PRN

## 2022-05-12 NOTE — Progress Notes (Unsigned)
    SUBJECTIVE:   CHIEF COMPLAINT / HPI:   Cheryl Bauer is a pleasant 46 year old woman who presents today for follow-up of her mood on Prozac and hydroxyzine.  An in person Arabic interpreter is utilized for the entire visit.  Her 66 year old daughter accompanies her.  Mood follow up Last visit 04/17/22, noted interval improvement with prozac, suggested trial of hydroxyzine. Today patient reports the pharmacy did not have the medication for her. Was taking prozac two tablets daily (40 mg) daily until it ran out.  I personally called her pharmacy [facility] [phone]), who reported - prozac was ordered as tablets, which are expensive - when they ran the insurance, it said the Medicaid was not active, ended 03/23/22 - pharmacy has prozac capsule rx for 20 mg w/ one refill they can use, $26 with good rx - hydroxyzine 10 mg tablets w/ one refill $9.98 with good rx  Additionally, her daughter reports problems with memory and forgetfulness.  Daughter has tried to help her mom steady with assistance of test, but reports that she finds it quite difficult because she cannot remember things because of her anxiety.  Daughter speaks English well and is working with Medicaid and DHS to get her parents reestablished on insurance.  N648 request Requests appointment to complete an N648 examination.  PERTINENT  PMH / PSH:  Patient Active Problem List   Diagnosis Date Noted   Depressed mood 03/25/2022   Carpal tunnel syndrome of left wrist 04-22-17   History of neonatal death April 15, 2017   Conductive hearing loss of right ear with unrestricted hearing of left ear 11/04/2015   Language barrier, speaks Arabic 05/31/2015   Refugee health examination 05/31/2015    OBJECTIVE:   BP 130/82   Pulse 65   Ht 5' (1.524 m)   Wt 173 lb 3.2 oz (78.6 kg)   LMP 03/27/2022 (Approximate)   SpO2 100%   BMI 33.83 kg/m    PHQ-9:     05/12/2022    4:00 PM 04/17/2022    3:36 PM  04/03/2022    4:02 PM  Depression screen PHQ 2/9  Decreased Interest 2 2 2   Down, Depressed, Hopeless 3 3 3   PHQ - 2 Score 5 5 5   Altered sleeping 2 2 2   Tired, decreased energy 3 2 3   Change in appetite 0 3 3  Feeling bad or failure about yourself  2 2 1   Trouble concentrating 0 3 3  Moving slowly or fidgety/restless 0 0 0  Suicidal thoughts 0 0 0  PHQ-9 Score 12 17 17   Difficult doing work/chores   Somewhat difficult    Physical Exam General: Awake, alert, oriented, intermittently tearful Respiratory: Unlabored respirations, speaking in full sentences, no respiratory distress Extremities: Moving all extremities spontaneously  ASSESSMENT/PLAN:   Depressed mood Stable.  Ran out of Prozac, and her Medicaid insurance ended 8/28.  After discussion, elected to send prescription for Prozac 40 mg x 3 months supply and hydroxyzine 10 mg PRN x 1 month supply to [facility] near their house.  Prozac is on $4 list.  Provided good Rx pharmacy coupon for the hydroxyzine.  Follow-up in 6 weeks for mood.  Refugee health examination Patient requests [phone] examination with our refugee clinic.  We will send message to our scheduler who will get her an appointment.     Ezequiel Essex, MD [place]

## 2022-05-13 ENCOUNTER — Telehealth: Payer: Self-pay | Admitting: Family Medicine

## 2022-05-13 NOTE — Assessment & Plan Note (Signed)
Patient requests [phone] examination with our refugee clinic.  We will send message to our scheduler who will get her an appointment.

## 2022-05-13 NOTE — Telephone Encounter (Signed)
Attempted to call patient with Arabic interpreter to discuss and complete screening form to obtain N648 appointment. No Arabic interpreter available.   Next tried to call daughter Renato Shin, however number wouldn't work x2.   Will try again later. The only missing information is whether or not Ms. Hopkinson has a driver's license and to confirm if she arrived in the [place] in 2016. The date I have is 04/05/2015.   Ezequiel Essex, MD

## 2022-05-13 NOTE — Assessment & Plan Note (Signed)
Stable.  Ran out of Prozac, and her Medicaid insurance ended 8/28.  After discussion, elected to send prescription for Prozac 40 mg x 3 months supply and hydroxyzine 10 mg PRN x 1 month supply to [facility] near their house.  Prozac is on $4 list.  Provided good Rx pharmacy coupon for the hydroxyzine.  Follow-up in 6 weeks for mood.

## 2022-05-25 NOTE — Telephone Encounter (Addendum)
Called patient again. Phone Arabic interpreter ID # [id number] utilized for duration of call.   Was able to reach patient's son, who said she was not able to come to the phone.  He was able to provide [place] with the 2 required piece of information: She arrived in the [place] in 2016 and she has a Pensions consultant but is not currently driving.  Will submit the screening form to Dr. Saul Fordyce inbox and FYI the admin for scheduling.   Ezequiel Essex, MD

## 2022-06-30 ENCOUNTER — Ambulatory Visit: Payer: Medicaid Other | Admitting: Family Medicine

## 2022-07-03 ENCOUNTER — Ambulatory Visit (INDEPENDENT_AMBULATORY_CARE_PROVIDER_SITE_OTHER): Payer: Medicaid Other | Admitting: Family Medicine

## 2022-07-03 ENCOUNTER — Other Ambulatory Visit: Payer: Self-pay

## 2022-07-03 VITALS — BP 137/91 | HR 91 | Wt 174.0 lb

## 2022-07-03 DIAGNOSIS — R4589 Other symptoms and signs involving emotional state: Secondary | ICD-10-CM | POA: Diagnosis present

## 2022-07-03 DIAGNOSIS — Z659 Problem related to unspecified psychosocial circumstances: Secondary | ICD-10-CM | POA: Diagnosis not present

## 2022-07-03 DIAGNOSIS — F419 Anxiety disorder, unspecified: Secondary | ICD-10-CM

## 2022-07-03 MED ORDER — FLUOXETINE HCL 40 MG PO CAPS: 40.0000 mg | ORAL_CAPSULE | Freq: Every day | ORAL | 3 refills | Status: DC

## 2022-07-03 MED ORDER — HYDROXYZINE HCL 10 MG PO TABS: 10.0000 mg | ORAL_TABLET | Freq: Three times a day (TID) | ORAL | 11 refills | Status: DC | PRN

## 2022-07-03 NOTE — Patient Instructions (Addendum)
Counseling Options:  (1) [facility] counseling- flyer attached and call this number: [phone]  Also have walk in hours for counseling  (2) [facility] [address] [place], [place], [place] [address]  Phone: [phone]   (3) [facility]: call  [phone]   ?????? ?????????:  (1) ???????? ????? ????? ???????? - ???? ???? ??????? ????? ???? ?????: [phone] ?? ????? ?????? ?????? ?????? ??? ???????  (2) ???? ???????? ?[address] ???? ????? ?????? ?????????? ????????? ???????? [address] ??????: [phone]  (3) ?????? ??????: ???? ?????? [phone] --------------------------------------------------------------- For legal help:  [facility]  [employer] [facility]  [address]  [place], [place] [address]  Phone: [phone]  Fax: [phone]  Contact is Lujean Rave    I will email Keenan Bachelor and see if she knows of any defensive lawyers to help your son.   ???????? ?????????:  ????? ????? ?????? ????????? ???? ?????? ?????? ????? ?.?. ?[address] ?????[address]? ???? ????????? [address] ??????: [phone] ????: [phone] ??? ??????? ?? ????? ???????  ????? ?????? ?????????? ??? ????? ???? ?? ??? ???? ???? ?? ?????? ??????? ??????? ????. --------------------------------------------------------------- Insurance: You likely qualify for Medicaid now that it has expanded!  See flyer for information.  [facility] has a program to help immigrants and refugees fill out the [facility] application [employer] [address] [place], [place], [place] [address]  Phone: [phone]  ?????: ?? ??????? ??? ???? ??????? Medicaid ???? ??? ?? ????! ???? ?????? ?????? ??? ???????. ??? ???? ???????? ????? ?????? ??????? ????????? ????????? ??? ??? ??? Medicaid ???? ???????? ????? [address] ???? ????? ?????? ?????????? ????????? ???????? [address] ??????: [phone]

## 2022-07-03 NOTE — Progress Notes (Unsigned)
    SUBJECTIVE:   CHIEF COMPLAINT / HPI:   In person female Arabic interpreter utilized for entirety of visit today.   Mood disorder follow up Cheryl Bauer presents for mood follow up s/p initiation of fluoxetine and hydroxyzine for mood and anxiety. She reports taking the medicines (although running out in the last several days) and tolerating them well. No adverse side effects.   She is quite distressed and tearful during this visit due to numerous life stressors, most notably including the recent death of two of her brothers in [place] and a legal case her son is involved in (with threat of deportation).   PERTINENT  PMH / PSH:  Patient Active Problem List   Diagnosis Date Noted   Other social stressor 07/04/2022   Depressed mood 03/25/2022   Carpal tunnel syndrome of left wrist 2017/05/04   History of neonatal death 2017-04-27   Conductive hearing loss of right ear with unrestricted hearing of left ear 11/04/2015   Language barrier, speaks Arabic 05/31/2015   Refugee health examination 05/31/2015    OBJECTIVE:   BP (!) 137/91   Pulse 91   Wt 174 lb (78.9 kg)   SpO2 99%   BMI 33.98 kg/m    Gen: awake, alert, tearful, severely emotionally distressed Resp: speaking clearly in full sentences, no respiratory distress  ASSESSMENT/PLAN:   Depressed mood Tolerating medications well. No adverse side effects. Extreme anxiety and tearfulness, due to enormous life stressors. Discussed that medication can only do so much to help in the face of significant events and grief. She is now amenable to counseling. Provided with information on available resources specifically for refugees and immigrants. See AVS for more. Plan to contact [employer] first.   Other social stressor Currently uninsured: Discussed recent Medicaid expansion. Provided flyer (in [place], for daughter to review), and information on program at [employer] to help ESL speakers complete the [facility]  application.   Legal issues with son: Provided information for [facility]. See AVS for more.      Fayette Pho, MD [facility]

## 2022-07-04 DIAGNOSIS — Z659 Problem related to unspecified psychosocial circumstances: Secondary | ICD-10-CM | POA: Insufficient documentation

## 2022-07-04 NOTE — Assessment & Plan Note (Signed)
Currently uninsured: Discussed recent Medicaid expansion. Provided flyer (in [place], for daughter to review), and information on program at [employer] to help ESL speakers complete the [facility] application.   Legal issues with son: Provided information for [facility]. See AVS for more.

## 2022-07-04 NOTE — Assessment & Plan Note (Signed)
Tolerating medications well. No adverse side effects. Extreme anxiety and tearfulness, due to enormous life stressors. Discussed that medication can only do so much to help in the face of significant events and grief. She is now amenable to counseling. Provided with information on available resources specifically for refugees and immigrants. See AVS for more. Plan to contact [employer] first.

## 2022-07-07 ENCOUNTER — Ambulatory Visit (INDEPENDENT_AMBULATORY_CARE_PROVIDER_SITE_OTHER): Payer: Medicaid Other | Admitting: Family Medicine

## 2022-07-07 VITALS — BP 118/90 | HR 73 | Wt 175.8 lb

## 2022-07-07 DIAGNOSIS — D649 Anemia, unspecified: Secondary | ICD-10-CM

## 2022-07-07 DIAGNOSIS — R4589 Other symptoms and signs involving emotional state: Secondary | ICD-10-CM | POA: Diagnosis not present

## 2022-07-07 DIAGNOSIS — F4310 Post-traumatic stress disorder, unspecified: Secondary | ICD-10-CM | POA: Diagnosis present

## 2022-07-07 DIAGNOSIS — R5383 Other fatigue: Secondary | ICD-10-CM | POA: Diagnosis not present

## 2022-07-07 DIAGNOSIS — Z0289 Encounter for other administrative examinations: Secondary | ICD-10-CM | POA: Diagnosis not present

## 2022-07-07 DIAGNOSIS — Z1159 Encounter for screening for other viral diseases: Secondary | ICD-10-CM

## 2022-07-07 NOTE — Patient Instructions (Addendum)
I have translated the following text using Google translate.  As such, there are many errors.  I apologize for the poor written translation; however, we do not have written  translation services yet.  ?? ??? ?????? ???? ?????? ???????? ????? ????. ???? ??? ?????? ???? ?????? ?? ???????. ????? ?? ??????? ???????? ??????? ??? ???? ??? ????? ????? ????? ?????? ??? ????.  N648 Today we evaluated you for the [phone] certification. We will file these for you.  A213 ???? ????? ??????? ?????? ??? ????? N648. ??? ???? ?????? ??? ??.  Grief counseling I called [facility] and left a message with their mental health coordinator Lawanna Kobus to get you set up. I hope they will call you this week.  [facility] counseling: [phone]  ???????? ????? ??? ????? ?? [facility] ????? ????? ?? [place] ???? ????? ??????? ???????. ??? ?? ?????? ?? ??? ???????. ???????? ????? ????? ????????: [phone]  Legal help I have reached out to someone at [facility] for suggestions. I have not yet heard back. Please call them too.  [facility]  [employer] [facility] [employer]  [address]  [place], [place] [address]  Phone: [phone]  Fax: [phone]  Contact is Lujean Rave   ???????? ????????? ??? ?????? ?? ??? ??????? ?? [facility] ?????? ??? ????????. ?? ???? ?? ?? ???. ???? ??????? ??? ????. ????? ????? ?????? ????????? ???? ?????? ?????? ????? ?.?. ????? [address] ?????????? ???? ????????? [address] ??????: [phone] ????: [phone] ??? ??????? ?? ????? ???????  Insurance  We will fill out a form to refer you to a program that assists people in applying for insurance. Someone should be calling you to set up an appointment. Please let us know if no one calls within 2 weeks.  ????? ????? ???? ????? ??????? ??? ?????? ????? ??????? ?? ?????? ???? ?????? ??? ???????. ??? ?? ???? ?? ??? ?? ?????? ????. ???? ??????? ??? ?? ???? ??? ???? ???????.  Vaccines,  screening We also drew blood to screen for Hepatitis C. This is a normal screening test we recommend for all adults.  ????????? ????? ??? ???? ????? ???? ???? ???? ?????? ????? C. ???? ?????? ??? ???? ???? ?? ????? ????????.  Please bring all of your medications to every appointment! If you have any questions or concerns, please do not hesitate to contact us via phone or MyChart message.  ???? ????? ???? ??????? ?????? ?? ?? ?? ????! ??? ???? ???? ??? ????? ?? ?????????? ??? ????? ?? ??????? ??? ??? ?????? ?? ??? ????? MyChart.  Fayette Pho, MD

## 2022-07-07 NOTE — Progress Notes (Signed)
Patient Name: Cheryl Bauer Date of Birth: 12-Jun-1976 Date of Visit: 07/07/22 PCP: Fayette Pho, MD  Chief Complaint: form completion   Subjective: Cheryl Bauer is a pleasant 46 y.o. with medical history significant for depressed mood, conductive hearing loss of right ear, suspected PTSD presenting today for completion of N-648 form.   The patient speaks Arabic as their primary language.  An interpreter was used for the entire visit.   The purpose of this visit was explained with to the patient and family members. The patient was interviewed alone.   Identification confirmed and documented on N-648.   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 x2 Have you previously taken [place] classes? 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   I have reviewed the patient's medical, surgical, family, and social history as appropriate.  Vitals:   07/07/22 1447  BP: (!) 118/90  Pulse: 73  SpO2: 100%    PHQ-9:     07/07/2022    3:30 PM 07/03/2022    5:16 PM 05/12/2022    4:00 PM  Depression screen PHQ 2/9  Decreased Interest 3 0 2  Down, Depressed, Hopeless 3 3 3   PHQ - 2 Score 6 3 5   Altered sleeping 3 3 2   Tired, decreased energy 3 2 3   Change in appetite 3 3 0  Feeling bad or failure about yourself  2 3 2   Trouble concentrating 3 3 0  Moving slowly or fidgety/restless 0 0 0  Suicidal thoughts 0 0 0  PHQ-9 Score 20 17 12   Difficult doing work/chores Somewhat difficult Extremely dIfficult     HEENT: Sclera anicteric. Appears well hydrated. Neck: Supple, thyroid normal to palpation Cardiac: Regular rate and rhythm. Normal S1/S2. No murmurs, rubs, or gallops appreciated. Lungs: Clear bilaterally to ascultation.  Skin: Warm, dry Psych: Pleasant and appropriate, intermittently tearful  Diagnoses and all orders for this visit:  Need for hepatitis C screening test -     Hepatitis C Antibody  PTSD (post-traumatic stress disorder) - this impacts activities of daily living, focus, and function - has been on several medications for this - Will complete N648   Anemia, unspecified type -     CBC  Other fatigue -     TSH  Depressed mood -     TSH  Declined COVID vaccine   Patient signed [phone] Interpreter signed  V-035: Arabic interpreter Daleen Bo   Patient's Primary Care Provider and Address: Fayette Pho, MD [facility] [address] [place], [place], [place] [address]   Fayette Pho, MD [facility] Teaching Service   Seen and examined with Dr. Larita Fife I discussed the plan of care with the resident physician and agree with documentation.  Terisa Starr, MD

## 2022-07-08 ENCOUNTER — Encounter: Payer: Self-pay | Admitting: Family Medicine

## 2022-07-08 LAB — CBC
Hematocrit: 38.6 % (ref 34.0–46.6)
Hemoglobin: 12.8 g/dL (ref 11.1–15.9)
MCH: 26.6 pg (ref 26.6–33.0)
MCHC: 33.2 g/dL (ref 31.5–35.7)
MCV: 80 fL (ref 79–97)
Platelets: 302 10*3/uL (ref 150–450)
RBC: 4.82 x10E6/uL (ref 3.77–5.28)
RDW: 12.5 % (ref 11.7–15.4)
WBC: 5.7 10*3/uL (ref 3.4–10.8)

## 2022-07-08 LAB — BASIC METABOLIC PANEL
BUN/Creatinine Ratio: 20 (ref 9–23)
BUN: 12 mg/dL (ref 6–24)
CO2: 22 mmol/L (ref 20–29)
Calcium: 9.8 mg/dL (ref 8.7–10.2)
Chloride: 103 mmol/L (ref 96–106)
Creatinine, Ser: 0.61 mg/dL (ref 0.57–1.00)
Glucose: 73 mg/dL (ref 70–99)
Potassium: 4.1 mmol/L (ref 3.5–5.2)
Sodium: 140 mmol/L (ref 134–144)
eGFR: 113 mL/min/{1.73_m2} (ref >59)

## 2022-07-08 LAB — HEPATITIS C ANTIBODY: Hep C Virus Ab: NONREACTIVE

## 2022-07-08 LAB — TSH: TSH: 0.781 u[IU]/mL (ref 0.450–4.500)

## 2022-07-10 ENCOUNTER — Telehealth: Payer: Self-pay | Admitting: Family Medicine

## 2022-07-10 ENCOUNTER — Other Ambulatory Visit: Payer: Self-pay | Admitting: Family Medicine

## 2022-07-10 DIAGNOSIS — R4589 Other symptoms and signs involving emotional state: Secondary | ICD-10-CM

## 2022-07-10 DIAGNOSIS — Z659 Problem related to unspecified psychosocial circumstances: Secondary | ICD-10-CM

## 2022-07-10 DIAGNOSIS — F4310 Post-traumatic stress disorder, unspecified: Secondary | ICD-10-CM

## 2022-07-10 NOTE — Progress Notes (Signed)
Referral to [facility] for Counseling.  Fayette Pho, MD

## 2022-07-10 NOTE — Telephone Encounter (Signed)
Spoke with Irven Shelling, LCSW at [facility], regarding this patient's referral to [facility] for counseling.   She currently works with immigrants and refugees who don't qualify for health insurance. Provides free therapy in [place] and [place]. Only available part time, works 10 hours weekly.   She reports that she is the only psychotherapist and there is no other additional counseling services at [facility]. She cannot serve this patient (or her husband) as they are Medicaid eligible.   She recommends [facility] and [facility]. Possibly [facility] She will text me information with therapists who speak Arabic.   There is the current barrier of no insurance coverage, but we have give them information on applying for Medicaid now that the state has expanded eligibility.   I plan to pass along the information to the patient of the Arabic-speaking therapists once I have their information.   Fayette Pho, MD

## 2022-07-29 ENCOUNTER — Telehealth: Payer: Self-pay | Admitting: Family Medicine

## 2022-07-29 NOTE — Telephone Encounter (Signed)
Attempted to call patient's daughter x 2 to advise that forms were ready for pick up. First attempt, someone answered the phone, however, call was disconnected.   Attempted to call back again, no answer, no ability to LVM.   Will place completed forms up at the front desk. Copy made and placed in batch scanning.   Will attempt to call back at later date.   Talbot Grumbling, RN

## 2022-07-29 NOTE — Telephone Encounter (Signed)
D638 completed. Reviewed, completed, and signed form.  Note routed to RN team inbasket and placed completed form in RN Wall pocket in the front office.Please call daughter to let her know ready for pick up   Martyn Malay, MD

## 2022-08-07 ENCOUNTER — Ambulatory Visit: Payer: Self-pay | Admitting: Family Medicine

## 2023-07-12 ENCOUNTER — Telehealth: Payer: Self-pay | Admitting: Family Medicine

## 2023-07-12 NOTE — Telephone Encounter (Signed)
Printed off copy of [phone] paperwork and gave to [facility].  She will call patient's daughter for pick up.  Glennie Hawk, CMA

## 2023-07-12 NOTE — Telephone Encounter (Signed)
Patient's daughter came in stating that Dr. Manson Passey had done her [phone] paperwork in [place]. She had an appt with immigration today and they had not received the paperwork. She is asking if we have a copy on file that she can just pick up. Please call patient's daughter Clearance Coots at [phone] if there are any questions.

## 2023-07-15 ENCOUNTER — Other Ambulatory Visit: Payer: Self-pay | Admitting: Family Medicine

## 2023-07-15 ENCOUNTER — Telehealth: Payer: Self-pay | Admitting: Family Medicine

## 2023-07-15 DIAGNOSIS — R4589 Other symptoms and signs involving emotional state: Secondary | ICD-10-CM

## 2023-07-15 DIAGNOSIS — F419 Anxiety disorder, unspecified: Secondary | ICD-10-CM

## 2023-07-15 MED ORDER — FLUOXETINE HCL 40 MG PO CAPS: 40.0000 mg | ORAL_CAPSULE | Freq: Every day | ORAL | 3 refills | Status: AC

## 2023-07-15 MED ORDER — HYDROXYZINE HCL 10 MG PO TABS: 10.0000 mg | ORAL_TABLET | Freq: Three times a day (TID) | ORAL | 3 refills | Status: AC | PRN

## 2023-07-15 NOTE — Telephone Encounter (Signed)
Patient's daughter came in stating that she needs both her Fluoxetine and Hydroxyzine refilled please

## 2023-07-15 NOTE — Progress Notes (Signed)
Received message from CMA that patient's daughter came in stating that she needs both her Fluoxetine and Hydroxyzine refilled for both.  Providing 3 months or refills.

## 2023-07-16 NOTE — Telephone Encounter (Signed)
Called patient using a [employer] ID: [id number] and LVM  to inform her about her medications were sent to [facility] on [address] and should be ready to pick up per Dr. Sharion Dove request.  Drusilla Kanner, CMA

## 2023-07-26 ENCOUNTER — Telehealth: Payer: Self-pay | Admitting: Family Medicine

## 2023-07-26 NOTE — Telephone Encounter (Signed)
Patient's daughter dropped off [phone] form for Dr. Manson Passey to fill out since the version of the form we have on file was expired. States form needs to be turned in by 08/21/23 at the latest. Last DOS was 07/07/22. Did make patient's daughter aware that she may need to make an appt since it has been over a year since we've seen her. Placed in [employer].

## 2023-08-02 ENCOUNTER — Telehealth: Payer: Self-pay | Admitting: Family Medicine

## 2023-08-02 NOTE — Telephone Encounter (Addendum)
 Attempted to call pt's daughter x2, someone answered both times then hung up. Also attempted house phone number with no answer. Used [employer] [phone]  If daughter calls back, will need to schedule appt with PCP or other physician sometime this month to have [phone] screening form filled out, and will need appt scheduled in Feb/March with either [facility] or Dr. Delores to have full [phone] form filled out. Full form is in Dr. Albertina box.    ----- Message from Suzann CHRISTELLA Delores sent at 08/01/2023  5:42 AM EST ----- Regarding: RE: W351 Form Hi Amy,  Thanks for messaging about this.   There is a process for this form completion. Unfortunately, refugee clinic is entirely full until the end of the month  and my clinic is full.   We can schedule the patient in February/March for the form. She should be seen before then by either her PCP or another physician to complete the short worksheet on teams for this.  Jeannene Tschetter--You will likely need to call this patient's daughter and discuss. I am happy to help. It looks like she also brought in the form (note not yet sent to you).  The form for the PCP/resident  who sees her to fill out is on Teams. There is a dotphrase to go with it. It is 5-6 questions that are super easy to answer to screen patients  Thanks CB ----- Message ----- From: Dorlene Greig SQUIBB Sent: 07/29/2023   3:02 PM EST To: Suzann CHRISTELLA Delores, MD; Rollene FORBES Keeling, MD Subject: [phone] Form                                      Hey Drs.Brown & Pray,  This patient's daughter dropped off [phone] form to be completed on 12/30. Patients daughter called back today(Jan.2) checking status of this form. I spoke with Dr. Keeling who said patient will need an hour long appointment visit to fill this form out again. Patient needs this form completed by Aug 17, 2023. Please call daughter to schedule this appointment. Contact number is [phone].   Thanks,  Amy

## 2023-08-02 NOTE — Telephone Encounter (Signed)
 Reviewed form and placed in PCP's box for completion.  Glennie Hawk, CMA

## 2023-08-06 ENCOUNTER — Ambulatory Visit (INDEPENDENT_AMBULATORY_CARE_PROVIDER_SITE_OTHER): Payer: Medicaid Other | Admitting: Family Medicine

## 2023-08-06 VITALS — BP 125/86 | HR 84 | Ht 60.0 in | Wt 183.4 lb

## 2023-08-06 DIAGNOSIS — Z603 Acculturation difficulty: Secondary | ICD-10-CM

## 2023-08-06 DIAGNOSIS — Z758 Other problems related to medical facilities and other health care: Secondary | ICD-10-CM | POA: Diagnosis not present

## 2023-08-06 NOTE — Progress Notes (Signed)
    SUBJECTIVE:   CHIEF COMPLAINT / HPI:   EJ is a 48yo F hx of depressed mood, refugee status and language barrier that p/f [phone] form completion. - Daughter was present as interpretor. - Per 08/02/23 phone note, pt will need N648 screening done today, then follow-up in refugee clinic for form completion. - Needs it filled out by 08/17/23 for her citizenship test - Per daughter, the citizenship office told her that they don't need the medical report updated. They just need the N648 updated.    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 Pt is getting paid by her job.     OBJECTIVE:   BP 125/86   Pulse 84   Ht 5' (1.524 m)   Wt 183 lb 6.4 oz (83.2 kg)   SpO2 100%   BMI 35.82 kg/m   General: Alert, woman. NAD. HEENT: NCAT. MMM. Resp:Normal WOB on RA.  Ext: Moves all ext spontaneously Skin: Warm, well perfused   ASSESSMENT/PLAN:    Assessment & Plan Language barrier Completed [phone] screening form. Pt is scheduled in March for refugee clinic to fill out N628 form. Will reach out to Dr Delores to see if we can accommodate getting pt in sooner.    Twyla Nearing, MD [facility]

## 2023-08-06 NOTE — Patient Instructions (Signed)
 Good to see you today - Thank you for coming in  Things we discussed today:  1) I will send a message to Dr. Manson Passey regarding your [phone] form. I am not sure we can get her in before her March date, but we can see if there are other options.

## 2023-08-08 NOTE — Assessment & Plan Note (Signed)
 Completed N648 screening form. Pt is scheduled in March for refugee clinic to fill out N628 form. Will reach out to Dr Manson Passey to see if we can accommodate getting pt in sooner.

## 2023-09-28 ENCOUNTER — Ambulatory Visit: Payer: BLUE CROSS/BLUE SHIELD

## 2023-11-09 ENCOUNTER — Ambulatory Visit
